# Patient Record
Sex: Female | Born: 1970 | Race: White | Hispanic: No | Marital: Married | Smoking: Never smoker
Health system: Southern US, Community
[De-identification: ages and names within clinical notes are randomized; demographics above are authoritative.]

## PROBLEM LIST (undated history)

## (undated) DIAGNOSIS — G43909 Migraine, unspecified, not intractable, without status migrainosus: Secondary | ICD-10-CM

## (undated) DIAGNOSIS — Z98891 History of uterine scar from previous surgery: Secondary | ICD-10-CM

## (undated) DIAGNOSIS — F419 Anxiety disorder, unspecified: Secondary | ICD-10-CM

## (undated) DIAGNOSIS — Z5189 Encounter for other specified aftercare: Secondary | ICD-10-CM

## (undated) DIAGNOSIS — IMO0001 Reserved for inherently not codable concepts without codable children: Secondary | ICD-10-CM

## (undated) DIAGNOSIS — S0990XA Unspecified injury of head, initial encounter: Secondary | ICD-10-CM

## (undated) DIAGNOSIS — R0602 Shortness of breath: Secondary | ICD-10-CM

## (undated) HISTORY — DX: Migraine, unspecified, not intractable, without status migrainosus: G43.909

## (undated) HISTORY — DX: Unspecified injury of head, initial encounter: S09.90XA

## (undated) HISTORY — PX: WISDOM TOOTH EXTRACTION: SHX21

---

## 2009-10-31 ENCOUNTER — Ambulatory Visit: Payer: Self-pay | Admitting: Family Medicine

## 2009-10-31 DIAGNOSIS — E669 Obesity, unspecified: Secondary | ICD-10-CM

## 2009-10-31 DIAGNOSIS — IMO0002 Reserved for concepts with insufficient information to code with codable children: Secondary | ICD-10-CM | POA: Insufficient documentation

## 2010-03-26 NOTE — Assessment & Plan Note (Signed)
Summary: NP,tcb   Vital Signs:  Patient profile:   40 year old female Weight:      180 pounds Temp:     98.4 degrees F oral Pulse rate:   69 / minute BP sitting:   115 / 80  (right arm) Cuff size:   regular  Vitals Entered By: Jimmy Footman, CMA (October 31, 2009 3:47 PM) CC: New Patient Is Patient Diabetic? No Pain Assessment Patient in pain? no        Primary Care Provider:  Helane Rima DO  CC:  New Patient.  History of Present Illness: 40 yo F presenting for New Patient exam. Concerns:  1. LUQ Pain: Intermittent, x several weeks, none today, no identified trigger, nothing makes better or worse, last BM today - soft, no N/V/D, sometimes has constipation and straining. Unsure of heartburn, no sour taste in mouth or back pain.   2. Pain with Sex: Not all of the time. With penetration only. LMP x 1 week ago. No concerns for STDs. Hx of HPV. 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 No pain with BM.  3. Hemorrhoids: From straining. No BRBPR recently.  Habits & Providers  Alcohol-Tobacco-Diet     Tobacco Status: never  Current Medications (verified): 1)  None  Allergies (verified): No Known Drug Allergies  Past History:  Past Medical History: None  Past Surgical History: None  Family History: Mother died at age 62 of lung cancer - adenocarcinoma, not a smoker. Father alive at age 23. Grandmother died of lung cancer - smoker.  Social History: Lives with husband, Rolm Gala. Has a dog and cat. 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 Endorses a healthy diet. For fun, gardens, shops, and watches TV. College graduate. Married. Denies tobacco and drug use. Ocassional wine. Does not exercise.Smoking Status:  never  Review of Systems General:  Denies chills and fever. CV:  Denies chest pain or discomfort, palpitations, shortness of breath with exertion, and swelling of feet. Resp:  Denies cough. GI:  Complains of abdominal pain and indigestion; denies bloody stools,  constipation, diarrhea, nausea, and vomiting.  Physical Exam  General:  Well-developed, well-nourished, in no acute distress; alert, appropriate and cooperative throughout examination. Vitals reviewed. Abdomen:  Bowel sounds positive,abdomen soft and non-tender without masses, organomegaly or hernias noted. Pulses:  R and L dorsalis pedis and posterior tibial pulses are full and equal bilaterally. Extremities:  No edema. Psych:  Oriented X3, memory intact for recent and remote, normally interactive, good eye contact, not anxious appearing, and not depressed appearing.     Impression & Recommendations:  Problem # 1:  ABDOMINAL PAIN, LEFT UPPER QUADRANT (ICD-789.02) Assessment New No RED FLAGs. Likely constipation versus dyspepsia. Discussed treatment for both, including maintaning soft stools and behavioral modication/OTC Zantac.  Problem # 2:  DYSPAREUNIA (ICD-625.0) Assessment: New Unclear cause at this time. DDx: lack of lubrication, infection, endometriosis. Will do exam at next visit - making CPE with PAP.  Problem # 3:  HEMORRHOIDS (ICD-455.6) Assessment: Unchanged No concerns today. Will eval at next visit during (see #2). Will check CBC.  Other Orders: Future Orders: TSH-[facility] ([id number]) ... 11/01/2010 CBC-[facility] (96295) ... 11/01/2010 Lipid-[facility] ([id number]) ... 11/01/2010 Comp Met-[facility] ([id number]) ... 11/02/2010  Patient Instructions: 1)  It was nice to meet you today! 2)  You may take over-the-counter Zantac for heartburn to see if it helps your abdominal pain. 3)  Please schedule a FULL PHYSICAL for your next visit.

## 2010-07-19 ENCOUNTER — Inpatient Hospital Stay (HOSPITAL_COMMUNITY): Payer: BC Managed Care – PPO

## 2010-07-19 ENCOUNTER — Inpatient Hospital Stay (HOSPITAL_COMMUNITY)
Admission: AD | Admit: 2010-07-19 | Discharge: 2010-07-19 | Disposition: A | Discharge status: Home / Self Care | Payer: BC Managed Care – PPO | Source: Ambulatory Visit | Attending: Obstetrics and Gynecology | Admitting: Obstetrics and Gynecology

## 2010-07-19 DIAGNOSIS — O209 Hemorrhage in early pregnancy, unspecified: Secondary | ICD-10-CM

## 2010-07-19 LAB — ABO/RH: ABO/RH(D): O NEG

## 2010-07-19 LAB — URINE MICROSCOPIC-ADD ON

## 2010-07-19 LAB — URINALYSIS, ROUTINE W REFLEX MICROSCOPIC
Bilirubin Urine: NEGATIVE
Glucose, UA: NEGATIVE mg/dL
Specific Gravity, Urine: 1.02 (ref 1.005–1.030)
Urobilinogen, UA: 0.2 mg/dL (ref 0.0–1.0)

## 2010-07-20 LAB — RH IMMUNE GLOBULIN WORKUP (NOT WOMEN'S HOSP)

## 2010-08-01 ENCOUNTER — Ambulatory Visit (INDEPENDENT_AMBULATORY_CARE_PROVIDER_SITE_OTHER): Payer: BC Managed Care – PPO | Admitting: Family Medicine

## 2010-08-01 ENCOUNTER — Encounter: Payer: Self-pay | Admitting: Family Medicine

## 2010-08-01 DIAGNOSIS — R0789 Other chest pain: Secondary | ICD-10-CM

## 2010-08-01 DIAGNOSIS — M549 Dorsalgia, unspecified: Secondary | ICD-10-CM

## 2010-08-01 NOTE — Patient Instructions (Signed)
We will get she is set up to see Jeanene Erb for evaluation and treatment of your back pain.  Remember to walk daily.  Avoid sitting.  Start folic acid today.  Return p.r.n.
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
## 2010-08-06 ENCOUNTER — Ambulatory Visit: Payer: Self-pay | Admitting: Family Medicine

## 2010-08-26 ENCOUNTER — Encounter (HOSPITAL_COMMUNITY): Payer: Self-pay | Admitting: *Deleted

## 2010-08-27 ENCOUNTER — Ambulatory Visit (HOSPITAL_COMMUNITY)
Admission: RE | Admit: 2010-08-27 | Payer: BC Managed Care – PPO | Source: Ambulatory Visit | Admitting: Obstetrics and Gynecology

## 2010-09-02 MED ORDER — DOXYCYCLINE HYCLATE 100 MG PO TABS: 200.0000 mg | ORAL_TABLET | Freq: Once | ORAL | Status: DC

## 2010-09-03 ENCOUNTER — Encounter (HOSPITAL_COMMUNITY): Payer: Self-pay | Admitting: Anesthesiology

## 2010-09-03 ENCOUNTER — Ambulatory Visit (HOSPITAL_COMMUNITY)
Admission: RE | Admit: 2010-09-03 | Discharge: 2010-09-03 | Disposition: A | Discharge status: Home / Self Care | Payer: BC Managed Care – PPO | Source: Ambulatory Visit | Attending: Obstetrics and Gynecology | Admitting: Obstetrics and Gynecology

## 2010-09-03 ENCOUNTER — Encounter (HOSPITAL_COMMUNITY)
Admission: RE | Disposition: A | Discharge status: Home / Self Care | Payer: Self-pay | Source: Ambulatory Visit | Attending: Obstetrics and Gynecology

## 2010-09-03 ENCOUNTER — Encounter (HOSPITAL_COMMUNITY): Payer: Self-pay | Admitting: *Deleted

## 2010-09-03 ENCOUNTER — Other Ambulatory Visit: Payer: Self-pay | Admitting: Obstetrics and Gynecology

## 2010-09-03 ENCOUNTER — Ambulatory Visit (HOSPITAL_COMMUNITY): Payer: BC Managed Care – PPO | Admitting: Anesthesiology

## 2010-09-03 DIAGNOSIS — O021 Missed abortion: Secondary | ICD-10-CM | POA: Insufficient documentation

## 2010-09-03 HISTORY — DX: Reserved for inherently not codable concepts without codable children: IMO0001

## 2010-09-03 HISTORY — PX: DILATION AND EVACUATION: SHX1459

## 2010-09-03 HISTORY — DX: Encounter for other specified aftercare: Z51.89

## 2010-09-03 LAB — CBC
MCH: 30.6 pg (ref 26.0–34.0)
MCV: 86.9 fL (ref 78.0–100.0)
Platelets: 236 10*3/uL (ref 150–400)
RBC: 4.51 MIL/uL (ref 3.87–5.11)
RDW: 12.6 % (ref 11.5–15.5)
WBC: 6.6 10*3/uL (ref 4.0–10.5)

## 2010-09-03 LAB — ABO/RH: ABO/RH(D): O NEG

## 2010-09-03 SURGERY — DILATION AND EVACUATION, UTERUS
Anesthesia: Monitor Anesthesia Care

## 2010-09-03 MED ORDER — HYDROCODONE-ACETAMINOPHEN 5-500 MG PO TABS: 1.0000 | ORAL_TABLET | Freq: Four times a day (QID) | ORAL | Status: AC | PRN

## 2010-09-03 MED ORDER — DEXAMETHASONE SODIUM PHOSPHATE 10 MG/ML IJ SOLN
INTRAMUSCULAR | Status: DC | PRN
  Administered 2010-09-03: 10 mg via INTRAVENOUS

## 2010-09-03 MED ORDER — LIDOCAINE HCL 1 % IJ SOLN
INTRAMUSCULAR | Status: DC | PRN
  Administered 2010-09-03: 10 mL via INTRADERMAL

## 2010-09-03 MED ORDER — LACTATED RINGERS IV SOLN
INTRAVENOUS | Status: DC
  Administered 2010-09-03: 07:00:00 via INTRAVENOUS

## 2010-09-03 MED ORDER — IBUPROFEN 800 MG PO TABS: 800.0000 mg | ORAL_TABLET | Freq: Three times a day (TID) | ORAL | Status: AC | PRN

## 2010-09-03 MED ORDER — DOXYCYCLINE HYCLATE 100 MG PO TABS
200.0000 mg | ORAL_TABLET | Freq: Once | ORAL | Status: AC
  Administered 2010-09-03: 200 mg via ORAL

## 2010-09-03 MED ORDER — KETOROLAC TROMETHAMINE 30 MG/ML IJ SOLN
INTRAMUSCULAR | Status: DC | PRN
  Administered 2010-09-03: 30 mg via INTRAVENOUS

## 2010-09-03 MED ORDER — DOXYCYCLINE HYCLATE 100 MG PO TABS
ORAL_TABLET | ORAL | Status: AC
  Filled 2010-09-03: qty 2

## 2010-09-03 MED ORDER — PROPOFOL 10 MG/ML IV EMUL
INTRAVENOUS | Status: DC | PRN
  Administered 2010-09-03 (×5): 20 mg via INTRAVENOUS

## 2010-09-03 MED ORDER — LACTATED RINGERS IV SOLN
INTRAVENOUS | Status: DC | PRN
  Administered 2010-09-03 (×2): via INTRAVENOUS

## 2010-09-03 MED ORDER — MIDAZOLAM HCL 5 MG/5ML IJ SOLN
INTRAMUSCULAR | Status: DC | PRN
  Administered 2010-09-03 (×2): 1 mg via INTRAVENOUS

## 2010-09-03 MED ORDER — FENTANYL CITRATE 0.05 MG/ML IJ SOLN
INTRAMUSCULAR | Status: DC | PRN
  Administered 2010-09-03 (×2): 50 ug via INTRAVENOUS

## 2010-09-03 MED ORDER — LIDOCAINE HCL (CARDIAC) 20 MG/ML IV SOLN
INTRAVENOUS | Status: DC | PRN
  Administered 2010-09-03: 80 mg via INTRAVENOUS

## 2010-09-03 MED ORDER — ONDANSETRON HCL 4 MG/2ML IJ SOLN
INTRAMUSCULAR | Status: DC | PRN
  Administered 2010-09-03: 4 mg via INTRAMUSCULAR

## 2010-09-03 SURGICAL SUPPLY — 19 items
CATH ROBINSON RED A/P 16FR (CATHETERS) ×2 IMPLANT
CLOTH BEACON ORANGE TIMEOUT ST (SAFETY) ×2 IMPLANT
DECANTER SPIKE VIAL GLASS SM (MISCELLANEOUS) ×2 IMPLANT
DRAPE UTILITY XL STRL (DRAPES) ×2 IMPLANT
GLOVE BIO SURGEON STRL SZ 6.5 (GLOVE) ×2 IMPLANT
GLOVE BIO SURGEON STRL SZ7 (GLOVE) ×2 IMPLANT
GOWN BRE IMP SLV AUR LG STRL (GOWN DISPOSABLE) ×4 IMPLANT
KIT BERKELEY 1ST TRIMESTER 3/8 (MISCELLANEOUS) ×2 IMPLANT
NEEDLE SPNL 22GX3.5 QUINCKE BK (NEEDLE) ×2 IMPLANT
NS IRRIG 1000ML POUR BTL (IV SOLUTION) ×2 IMPLANT
PACK VAGINAL MINOR WOMEN LF (CUSTOM PROCEDURE TRAY) ×2 IMPLANT
PAD PREP 24X48 CUFFED NSTRL (MISCELLANEOUS) ×2 IMPLANT
SET BERKELEY SUCTION TUBING (SUCTIONS) ×2 IMPLANT
SYR CONTROL 10ML LL (SYRINGE) ×2 IMPLANT
TOWEL OR 17X24 6PK STRL BLUE (TOWEL DISPOSABLE) ×4 IMPLANT
VACURETTE 10 RIGID CVD (CANNULA) IMPLANT
VACURETTE 7MM CVD STRL WRAP (CANNULA) ×2 IMPLANT
VACURETTE 8 RIGID CVD (CANNULA) IMPLANT
VACURETTE 9 RIGID CVD (CANNULA) IMPLANT

## 2010-09-03 NOTE — Preoperative (Signed)
Beta Blockers   Reason not to administer Beta Blockers:Not Applicable 

## 2010-09-03 NOTE — Op Note (Signed)
NAMEDorothy Smith NO.:  [id number]  MEDICAL RECORD NO.:  [id number]  LOCATION:  [facility]                          FACILITY:  [facility]  PHYSICIAN:  Sherron Monday, MD        DATE OF BIRTH:  09-29-1970  DATE OF PROCEDURE:  09/03/2010 DATE OF DISCHARGE:                              OPERATIVE REPORT   ADMITTING DIAGNOSIS:  Missed abortion at approximately 7 weeks.  POSTOPERATIVE DIAGNOSIS:  Missed abortion at approximately 7 weeks.  PROCEDURE:  Suction dilation and curettage.  ANESTHESIA:  MAC.  SURGEON:  Sherron Monday, MD.  FINDINGS:  A 7 to 8-week size uterus with products of conception.  ESTIMATED BLOOD LOSS:  Minimal.  IV FLUIDS:  Approximately a liter.  URINE OUTPUT:  Approximately 100 mL.  PATHOLOGY:  Products of conception.  COMPLICATIONS:  None.  DISPOSITION:  Stable to PACU.  PROCEDURE:  After informed consent was reviewed with the patient and her husband, she was transported to the OR, placed on the table in supine position.  MAC anesthesia was induced and found to be adequate.  She was then placed in the [place] stirrups, prepped and draped in the normal sterile fashion. Using an open-sided speculum, her cervix was easily visualized.  A paracervical block was placed with 10 mL of 1% lidocaine at 12, 5 and 7 o'clock.  Her cervix was then dilated to accommodate a 7- French curette, was dilated with Pratt dilators to 21.  The brief suction of the uterus was performed yielding products of conception. Hemostasis was assured.  The tenaculum was removed from the cervix. Speculum was removed.  The patient was returned to supine position, awake and in stable condition.  Sponge, lap and needle counts were correct x2 at the end of the case per the operating room staff.     Sherron Monday, MD     JB/MEDQ  D:  09/03/2010  T:  09/03/2010  Job:  [id number]

## 2010-09-03 NOTE — Anesthesia Postprocedure Evaluation (Signed)
Vital signs stable Patient alert Pain and nausea are controlled No apparent anesthetic complications No follow up care needed 
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
## 2010-09-03 NOTE — Addendum Note (Signed)
Addendum  created 09/03/10 0820 by Doreene Burke   Modules edited:Notes Section

## 2010-09-03 NOTE — H&P (Addendum)
09/03/10 40 yo G1 with missed Ab for d&E.  D/W pt R/B/A of surgical procedure.  Pt has not passed tissue and has had repeat US PMH none PSH WTE POBGYNHx G1 present -missed AB H/o abn pap, repeat nl, no STD Meds none All PCN SH denies tob, drugs, occ ETOH; married FH: m lung CA, fa HTN MBT: O- PE AFVSS gen NAD CV RRR Lungs CTAB Abd soft, NT, ND +BS Ext sym, NT  40 yo G1 at 7wk with missed AB for suction D&C Doxycycline for abx  JBOVARD MD Pt received Rhogam prior.

## 2010-09-03 NOTE — Anesthesia Postprocedure Evaluation (Signed)
  Anesthesia Post-op Note  

## 2010-09-03 NOTE — Addendum Note (Signed)
Addendum  created 09/03/10 0910 by Sandrea Hughs.   Modules edited:Anesthesia Attestations

## 2010-09-03 NOTE — Addendum Note (Signed)
Addendum  created 09/03/10 0847 by Sandrea Hughs.   Modules edited:Notes Section

## 2010-09-03 NOTE — Op Note (Addendum)
09/03/10  PreOp Missed AB Post OP Missed AB Procedure suction D&C Surgeon [name] Anes MAC Findings 7 wk size uterus, POC Path PIOC EBL minimal, IVF 1000c, uop 100cc Comp none  Path POC Dispo stable to PACU  Dictation # [id number]

## 2010-09-03 NOTE — Transfer of Care (Signed)
Immediate Anesthesia Transfer of Care Note  Patient: [name]  Procedure(s) Performed:  DILATATION AND EVACUATION (D&E)  Patient Location: PACU  Anesthesia Type: MAC  Level of Consciousness: awake, alert , oriented and patient cooperative  Airway & Oxygen Therapy: Patient Spontanous Breathing and Patient connected to nasal cannula oxygen  Post-op Assessment: Report given to PACU RN, Post -op Vital signs reviewed and stable and Patient moving all extremities  Post vital signs: Reviewed and stable  Complications: No apparent anesthesia complications

## 2010-09-03 NOTE — Addendum Note (Signed)
Addendum  created 09/03/10 0910 by Sandrea Hughs.   Modules edited:Anesthesia Attestations, Notes Section

## 2010-09-24 ENCOUNTER — Encounter (HOSPITAL_COMMUNITY): Payer: Self-pay | Admitting: Obstetrics and Gynecology

## 2010-09-25 DEATH — deceased

## 2010-12-31 ENCOUNTER — Other Ambulatory Visit: Payer: Self-pay | Admitting: Obstetrics and Gynecology

## 2010-12-31 ENCOUNTER — Other Ambulatory Visit: Payer: Self-pay | Admitting: Family Medicine

## 2011-01-01 ENCOUNTER — Other Ambulatory Visit: Payer: Self-pay | Admitting: Obstetrics and Gynecology

## 2011-01-01 DIAGNOSIS — Z1231 Encounter for screening mammogram for malignant neoplasm of breast: Secondary | ICD-10-CM

## 2011-02-04 ENCOUNTER — Ambulatory Visit
Admission: RE | Admit: 2011-02-04 | Discharge: 2011-02-04 | Disposition: A | Discharge status: Home / Self Care | Payer: BC Managed Care – PPO | Source: Ambulatory Visit | Attending: Obstetrics and Gynecology | Admitting: Obstetrics and Gynecology

## 2011-02-04 DIAGNOSIS — Z1231 Encounter for screening mammogram for malignant neoplasm of breast: Secondary | ICD-10-CM

## 2011-03-20 ENCOUNTER — Emergency Department (HOSPITAL_COMMUNITY)
Admission: EM | Admit: 2011-03-20 | Discharge: 2011-03-21 | Disposition: A | Discharge status: Home / Self Care | Payer: No Typology Code available for payment source | Attending: Emergency Medicine | Admitting: Emergency Medicine

## 2011-03-20 ENCOUNTER — Emergency Department (HOSPITAL_COMMUNITY): Payer: No Typology Code available for payment source

## 2011-03-20 ENCOUNTER — Encounter (HOSPITAL_COMMUNITY): Payer: Self-pay | Admitting: Emergency Medicine

## 2011-03-20 DIAGNOSIS — T1490XA Injury, unspecified, initial encounter: Secondary | ICD-10-CM | POA: Insufficient documentation

## 2011-03-20 DIAGNOSIS — M5380 Other specified dorsopathies, site unspecified: Secondary | ICD-10-CM | POA: Insufficient documentation

## 2011-03-20 DIAGNOSIS — M545 Low back pain, unspecified: Secondary | ICD-10-CM | POA: Insufficient documentation

## 2011-03-20 DIAGNOSIS — M25569 Pain in unspecified knee: Secondary | ICD-10-CM | POA: Insufficient documentation

## 2011-03-20 DIAGNOSIS — M542 Cervicalgia: Secondary | ICD-10-CM | POA: Insufficient documentation

## 2011-03-20 LAB — POCT PREGNANCY, URINE: Preg Test, Ur: NEGATIVE

## 2011-03-20 MED ORDER — CYCLOBENZAPRINE HCL 10 MG PO TABS: 10.0000 mg | ORAL_TABLET | Freq: Two times a day (BID) | ORAL | Status: AC | PRN

## 2011-03-20 MED ORDER — ONDANSETRON 4 MG PO TBDP
4.0000 mg | ORAL_TABLET | Freq: Once | ORAL | Status: DC
  Filled 2011-03-20: qty 1

## 2011-03-20 MED ORDER — OXYCODONE-ACETAMINOPHEN 5-325 MG PO TABS
1.0000 | ORAL_TABLET | Freq: Once | ORAL | Status: AC
  Administered 2011-03-20: 1 via ORAL
  Filled 2011-03-20: qty 1

## 2011-03-20 MED ORDER — ONDANSETRON 4 MG PO TBDP
ORAL_TABLET | ORAL | Status: AC
  Filled 2011-03-20: qty 1

## 2011-03-20 MED ORDER — OXYCODONE-ACETAMINOPHEN 5-325 MG PO TABS
2.0000 | ORAL_TABLET | Freq: Once | ORAL | Status: AC
  Administered 2011-03-20: 2 via ORAL
  Filled 2011-03-20: qty 2

## 2011-03-20 MED ORDER — OXYCODONE-ACETAMINOPHEN 5-325 MG PO TABS: 1.0000 | ORAL_TABLET | Freq: Four times a day (QID) | ORAL | Status: AC | PRN
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
## 2011-03-20 NOTE — ED Notes (Signed)
MD/PA Neva Seat T. at bedside assessing the pt.

## 2011-03-20 NOTE — ED Notes (Signed)
Urine collected for the worker  Comp, per pt, it is require by her work

## 2011-03-20 NOTE — ED Notes (Signed)
Pt brought to the fast track room, pt states that she still feels sore, pt reports back pain that starting at the neck area, bilateral, radiating to the shoulders and to the lower back. Upon assessment and palpation, pt can not tolerated anyone touching her back, states it is in a excruciating pain. No bruising or visible deformities noted at this time to the affected area.
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
## 2011-03-21 NOTE — ED Provider Notes (Signed)
Medical screening examination/treatment/procedure(s) were performed by non-physician practitioner and as supervising physician I was immediately available for consultation/collaboration. Vallory Oetken, MD, FACEP   Denvil Canning L Kolston Lacount, MD 03/21/11 1129 

## 2011-06-30 LAB — OB RESULTS CONSOLE ABO/RH: RH Type: NEGATIVE

## 2011-11-10 ENCOUNTER — Other Ambulatory Visit: Payer: Self-pay | Admitting: Obstetrics and Gynecology

## 2011-11-10 DIAGNOSIS — N6009 Solitary cyst of unspecified breast: Secondary | ICD-10-CM

## 2011-11-10 DIAGNOSIS — N644 Mastodynia: Secondary | ICD-10-CM

## 2011-11-11 ENCOUNTER — Other Ambulatory Visit: Payer: Self-pay | Admitting: Obstetrics and Gynecology

## 2011-11-11 ENCOUNTER — Ambulatory Visit
Admission: RE | Admit: 2011-11-11 | Discharge: 2011-11-11 | Disposition: A | Discharge status: Home / Self Care | Payer: BC Managed Care – PPO | Source: Ambulatory Visit | Attending: Obstetrics and Gynecology | Admitting: Obstetrics and Gynecology

## 2011-11-11 DIAGNOSIS — N644 Mastodynia: Secondary | ICD-10-CM

## 2011-11-11 DIAGNOSIS — N6009 Solitary cyst of unspecified breast: Secondary | ICD-10-CM

## 2011-11-14 ENCOUNTER — Other Ambulatory Visit: Payer: BC Managed Care – PPO

## 2011-11-17 ENCOUNTER — Ambulatory Visit
Admission: RE | Admit: 2011-11-17 | Discharge: 2011-11-17 | Disposition: A | Discharge status: Home / Self Care | Payer: BC Managed Care – PPO | Source: Ambulatory Visit | Attending: Obstetrics and Gynecology | Admitting: Obstetrics and Gynecology

## 2011-11-17 ENCOUNTER — Other Ambulatory Visit: Payer: Self-pay | Admitting: Diagnostic Radiology

## 2011-11-17 DIAGNOSIS — N6009 Solitary cyst of unspecified breast: Secondary | ICD-10-CM

## 2011-11-17 DIAGNOSIS — N644 Mastodynia: Secondary | ICD-10-CM

## 2011-12-31 ENCOUNTER — Encounter (HOSPITAL_COMMUNITY): Payer: Self-pay | Admitting: Pharmacist

## 2012-01-08 ENCOUNTER — Encounter (HOSPITAL_COMMUNITY): Payer: Self-pay

## 2012-01-09 ENCOUNTER — Encounter (HOSPITAL_COMMUNITY): Payer: Self-pay

## 2012-01-09 ENCOUNTER — Encounter (HOSPITAL_COMMUNITY)
Admission: RE | Admit: 2012-01-09 | Discharge: 2012-01-09 | Disposition: A | Discharge status: Home / Self Care | Payer: BC Managed Care – PPO | Source: Ambulatory Visit | Attending: Obstetrics and Gynecology | Admitting: Obstetrics and Gynecology

## 2012-01-09 HISTORY — DX: Anxiety disorder, unspecified: F41.9

## 2012-01-09 HISTORY — DX: Shortness of breath: R06.02

## 2012-01-09 LAB — CBC
Platelets: 167 10*3/uL (ref 150–400)
RDW: 13.7 % (ref 11.5–15.5)
WBC: 10.5 10*3/uL (ref 4.0–10.5)

## 2012-01-09 LAB — TYPE AND SCREEN
ABO/RH(D): O NEG
Antibody Screen: POSITIVE

## 2012-01-09 LAB — RPR: RPR Ser Ql: NONREACTIVE

## 2012-01-09 LAB — SURGICAL PCR SCREEN: Staphylococcus aureus: NEGATIVE

## 2012-01-09 NOTE — Patient Instructions (Addendum)
Your procedure is scheduled on:01/20/12  Enter through the Main Entrance at :0700 am Pick up desk phone and dial [phone] and inform us of your arrival.  Please call [phone] if you have any problems the morning of surgery.  Remember: Do not eat or drink after midnight: Monday   Take these meds the morning of surgery with a sip of water:none  DO NOT wear jewelry, eye make-up, lipstick,body lotion, or dark fingernail polish. Do not shave for 48 hours prior to surgery.  If you are to be admitted after surgery, leave suitcase in car until your room has been assigned.
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
## 2012-01-20 ENCOUNTER — Encounter (HOSPITAL_COMMUNITY): Payer: Self-pay | Admitting: *Deleted

## 2012-01-20 ENCOUNTER — Inpatient Hospital Stay (HOSPITAL_COMMUNITY): Payer: BC Managed Care – PPO | Admitting: Anesthesiology

## 2012-01-20 ENCOUNTER — Encounter (HOSPITAL_COMMUNITY): Payer: Self-pay | Admitting: Anesthesiology

## 2012-01-20 ENCOUNTER — Inpatient Hospital Stay (HOSPITAL_COMMUNITY)
Admission: AD | Admit: 2012-01-20 | Discharge: 2012-01-22 | DRG: 371 | Disposition: A | Discharge status: Home / Self Care | Payer: BC Managed Care – PPO | Source: Ambulatory Visit | Attending: Obstetrics and Gynecology | Admitting: Obstetrics and Gynecology

## 2012-01-20 ENCOUNTER — Encounter (HOSPITAL_COMMUNITY)
Admission: AD | Disposition: A | Discharge status: Home / Self Care | Payer: Self-pay | Source: Ambulatory Visit | Attending: Obstetrics and Gynecology

## 2012-01-20 DIAGNOSIS — O09519 Supervision of elderly primigravida, unspecified trimester: Secondary | ICD-10-CM | POA: Diagnosis present

## 2012-01-20 DIAGNOSIS — D259 Leiomyoma of uterus, unspecified: Secondary | ICD-10-CM | POA: Diagnosis present

## 2012-01-20 DIAGNOSIS — O34599 Maternal care for other abnormalities of gravid uterus, unspecified trimester: Secondary | ICD-10-CM | POA: Diagnosis present

## 2012-01-20 DIAGNOSIS — D4959 Neoplasm of unspecified behavior of other genitourinary organ: Secondary | ICD-10-CM | POA: Diagnosis present

## 2012-01-20 DIAGNOSIS — Z98891 History of uterine scar from previous surgery: Secondary | ICD-10-CM

## 2012-01-20 DIAGNOSIS — O99214 Obesity complicating childbirth: Secondary | ICD-10-CM | POA: Diagnosis present

## 2012-01-20 DIAGNOSIS — E669 Obesity, unspecified: Secondary | ICD-10-CM | POA: Diagnosis present

## 2012-01-20 DIAGNOSIS — O321XX0 Maternal care for breech presentation, not applicable or unspecified: Principal | ICD-10-CM | POA: Diagnosis present

## 2012-01-20 HISTORY — DX: History of uterine scar from previous surgery: Z98.891

## 2012-01-20 HISTORY — PX: MYOMECTOMY: SHX85

## 2012-01-20 SURGERY — Surgical Case
Anesthesia: Spinal | Site: Abdomen | Wound class: Clean Contaminated

## 2012-01-20 MED ORDER — SODIUM CHLORIDE 0.9 % IJ SOLN: 3.0000 mL | INTRAMUSCULAR | Status: DC | PRN

## 2012-01-20 MED ORDER — NALBUPHINE HCL 10 MG/ML IJ SOLN
5.0000 mg | INTRAMUSCULAR | Status: DC | PRN
  Administered 2012-01-20: 10 mg via SUBCUTANEOUS
  Filled 2012-01-20 (×2): qty 1

## 2012-01-20 MED ORDER — DIPHENHYDRAMINE HCL 25 MG PO CAPS: 25.0000 mg | ORAL_CAPSULE | ORAL | Status: DC | PRN

## 2012-01-20 MED ORDER — FENTANYL CITRATE 0.05 MG/ML IJ SOLN
INTRAMUSCULAR | Status: AC
  Filled 2012-01-20: qty 2

## 2012-01-20 MED ORDER — NALOXONE HCL 0.4 MG/ML IJ SOLN: 0.4000 mg | INTRAMUSCULAR | Status: DC | PRN

## 2012-01-20 MED ORDER — ONDANSETRON HCL 4 MG/2ML IJ SOLN: 4.0000 mg | INTRAMUSCULAR | Status: DC | PRN

## 2012-01-20 MED ORDER — MORPHINE SULFATE 0.5 MG/ML IJ SOLN
INTRAMUSCULAR | Status: AC
  Filled 2012-01-20: qty 10

## 2012-01-20 MED ORDER — SIMETHICONE 80 MG PO CHEW: 80.0000 mg | CHEWABLE_TABLET | ORAL | Status: DC | PRN

## 2012-01-20 MED ORDER — PHENYLEPHRINE 40 MCG/ML (10ML) SYRINGE FOR IV PUSH (FOR BLOOD PRESSURE SUPPORT)
PREFILLED_SYRINGE | INTRAVENOUS | Status: AC
  Filled 2012-01-20: qty 5

## 2012-01-20 MED ORDER — SENNOSIDES-DOCUSATE SODIUM 8.6-50 MG PO TABS
2.0000 | ORAL_TABLET | Freq: Every day | ORAL | Status: DC
  Administered 2012-01-20 – 2012-01-21 (×2): 2 via ORAL

## 2012-01-20 MED ORDER — LACTATED RINGERS IV SOLN
INTRAVENOUS | Status: DC
  Administered 2012-01-20 (×2): via INTRAVENOUS

## 2012-01-20 MED ORDER — DIPHENHYDRAMINE HCL 25 MG PO CAPS: 25.0000 mg | ORAL_CAPSULE | Freq: Four times a day (QID) | ORAL | Status: DC | PRN

## 2012-01-20 MED ORDER — DIPHENHYDRAMINE HCL 50 MG/ML IJ SOLN: 12.5000 mg | INTRAMUSCULAR | Status: DC | PRN

## 2012-01-20 MED ORDER — NALOXONE HCL 1 MG/ML IJ SOLN
1.0000 ug/kg/h | INTRAVENOUS | Status: DC | PRN
  Filled 2012-01-20: qty 2

## 2012-01-20 MED ORDER — EPHEDRINE 5 MG/ML INJ
INTRAVENOUS | Status: AC
  Filled 2012-01-20: qty 10

## 2012-01-20 MED ORDER — OXYTOCIN 10 UNIT/ML IJ SOLN
INTRAMUSCULAR | Status: AC
  Filled 2012-01-20: qty 1

## 2012-01-20 MED ORDER — MEPERIDINE HCL 25 MG/ML IJ SOLN: 6.2500 mg | INTRAMUSCULAR | Status: DC | PRN

## 2012-01-20 MED ORDER — TETANUS-DIPHTH-ACELL PERTUSSIS 5-2.5-18.5 LF-MCG/0.5 IM SUSP
0.5000 mL | Freq: Once | INTRAMUSCULAR | Status: AC
  Administered 2012-01-22: 0.5 mL via INTRAMUSCULAR
  Filled 2012-01-20: qty 0.5

## 2012-01-20 MED ORDER — SCOPOLAMINE 1 MG/3DAYS TD PT72
MEDICATED_PATCH | TRANSDERMAL | Status: AC
  Administered 2012-01-20: 1.5 mg via TRANSDERMAL
  Filled 2012-01-20: qty 1

## 2012-01-20 MED ORDER — PRENATAL 27-0.8 MG PO TABS
1.0000 | ORAL_TABLET | Freq: Every day | ORAL | Status: DC
  Filled 2012-01-20 (×4): qty 1

## 2012-01-20 MED ORDER — NALBUPHINE HCL 10 MG/ML IJ SOLN
5.0000 mg | INTRAMUSCULAR | Status: DC | PRN
  Filled 2012-01-20: qty 1

## 2012-01-20 MED ORDER — ONDANSETRON HCL 4 MG/2ML IJ SOLN
INTRAMUSCULAR | Status: AC
  Filled 2012-01-20: qty 2

## 2012-01-20 MED ORDER — LACTATED RINGERS IV SOLN: INTRAVENOUS | Status: DC

## 2012-01-20 MED ORDER — FENTANYL CITRATE 0.05 MG/ML IJ SOLN: 25.0000 ug | INTRAMUSCULAR | Status: DC | PRN

## 2012-01-20 MED ORDER — FENTANYL CITRATE 0.05 MG/ML IJ SOLN
INTRAMUSCULAR | Status: DC | PRN
  Administered 2012-01-20: 25 ug via INTRATHECAL

## 2012-01-20 MED ORDER — ONDANSETRON HCL 4 MG/2ML IJ SOLN
INTRAMUSCULAR | Status: DC | PRN
  Administered 2012-01-20: 4 mg via INTRAVENOUS

## 2012-01-20 MED ORDER — MENTHOL 3 MG MT LOZG: 1.0000 | LOZENGE | OROMUCOSAL | Status: DC | PRN

## 2012-01-20 MED ORDER — GENTAMICIN SULFATE 40 MG/ML IJ SOLN
INTRAVENOUS | Status: AC
  Administered 2012-01-20: 365.2 mL via INTRAVENOUS
  Filled 2012-01-20: qty 9.13

## 2012-01-20 MED ORDER — CALCIUM CARBONATE ANTACID 500 MG PO CHEW: 3.0000 | CHEWABLE_TABLET | Freq: Three times a day (TID) | ORAL | Status: DC | PRN

## 2012-01-20 MED ORDER — DIPHENHYDRAMINE HCL 50 MG/ML IJ SOLN: 25.0000 mg | INTRAMUSCULAR | Status: DC | PRN

## 2012-01-20 MED ORDER — SIMETHICONE 80 MG PO CHEW
80.0000 mg | CHEWABLE_TABLET | Freq: Three times a day (TID) | ORAL | Status: DC
  Administered 2012-01-20 – 2012-01-22 (×5): 80 mg via ORAL

## 2012-01-20 MED ORDER — DIBUCAINE 1 % RE OINT: 1.0000 "application " | TOPICAL_OINTMENT | RECTAL | Status: DC | PRN

## 2012-01-20 MED ORDER — WITCH HAZEL-GLYCERIN EX PADS: 1.0000 "application " | MEDICATED_PAD | CUTANEOUS | Status: DC | PRN

## 2012-01-20 MED ORDER — KETOROLAC TROMETHAMINE 30 MG/ML IJ SOLN: 30.0000 mg | Freq: Four times a day (QID) | INTRAMUSCULAR | Status: AC | PRN

## 2012-01-20 MED ORDER — LACTATED RINGERS IV SOLN
INTRAVENOUS | Status: DC | PRN
  Administered 2012-01-20 (×4): via INTRAVENOUS

## 2012-01-20 MED ORDER — EPHEDRINE SULFATE 50 MG/ML IJ SOLN
INTRAMUSCULAR | Status: DC | PRN
  Administered 2012-01-20: 10 mg via INTRAVENOUS
  Administered 2012-01-20 (×3): 5 mg via INTRAVENOUS

## 2012-01-20 MED ORDER — LACTATED RINGERS IV SOLN
40.0000 [IU] | INTRAVENOUS | Status: DC | PRN
  Administered 2012-01-20: 40 [IU] via INTRAVENOUS

## 2012-01-20 MED ORDER — LACTATED RINGERS IV SOLN
INTRAVENOUS | Status: DC | PRN
  Administered 2012-01-20: 09:00:00 via INTRAVENOUS

## 2012-01-20 MED ORDER — ONDANSETRON HCL 4 MG PO TABS: 4.0000 mg | ORAL_TABLET | ORAL | Status: DC | PRN

## 2012-01-20 MED ORDER — MORPHINE SULFATE (PF) 0.5 MG/ML IJ SOLN
INTRAMUSCULAR | Status: DC | PRN
  Administered 2012-01-20: .15 mg via INTRATHECAL

## 2012-01-20 MED ORDER — PRENATAL MULTIVITAMIN CH
1.0000 | ORAL_TABLET | Freq: Every day | ORAL | Status: DC
  Administered 2012-01-21 – 2012-01-22 (×2): 1 via ORAL

## 2012-01-20 MED ORDER — IBUPROFEN 600 MG PO TABS
600.0000 mg | ORAL_TABLET | Freq: Four times a day (QID) | ORAL | Status: DC | PRN
  Administered 2012-01-20: 600 mg via ORAL
  Filled 2012-01-20 (×2): qty 1

## 2012-01-20 MED ORDER — PHENYLEPHRINE HCL 10 MG/ML IJ SOLN
INTRAMUSCULAR | Status: DC | PRN
  Administered 2012-01-20: 80 ug via INTRAVENOUS
  Administered 2012-01-20 (×2): 120 ug via INTRAVENOUS
  Administered 2012-01-20: 40 ug via INTRAVENOUS
  Administered 2012-01-20 (×4): 80 ug via INTRAVENOUS
  Administered 2012-01-20: 40 ug via INTRAVENOUS
  Administered 2012-01-20: 80 ug via INTRAVENOUS

## 2012-01-20 MED ORDER — ONDANSETRON HCL 4 MG/2ML IJ SOLN: 4.0000 mg | Freq: Three times a day (TID) | INTRAMUSCULAR | Status: DC | PRN

## 2012-01-20 MED ORDER — IBUPROFEN 800 MG PO TABS
800.0000 mg | ORAL_TABLET | Freq: Three times a day (TID) | ORAL | Status: DC
  Administered 2012-01-20 – 2012-01-22 (×6): 800 mg via ORAL
  Filled 2012-01-20 (×5): qty 1

## 2012-01-20 MED ORDER — 0.9 % SODIUM CHLORIDE (POUR BTL) OPTIME
TOPICAL | Status: DC | PRN
  Administered 2012-01-20: 1000 mL

## 2012-01-20 MED ORDER — LACTATED RINGERS IV SOLN
INTRAVENOUS | Status: DC
  Administered 2012-01-20: 07:00:00 via INTRAVENOUS

## 2012-01-20 MED ORDER — SCOPOLAMINE 1 MG/3DAYS TD PT72
1.0000 | MEDICATED_PATCH | Freq: Once | TRANSDERMAL | Status: DC
  Administered 2012-01-20: 1.5 mg via TRANSDERMAL

## 2012-01-20 MED ORDER — METOCLOPRAMIDE HCL 5 MG/ML IJ SOLN: 10.0000 mg | Freq: Three times a day (TID) | INTRAMUSCULAR | Status: DC | PRN

## 2012-01-20 MED ORDER — OXYTOCIN 40 UNITS IN LACTATED RINGERS INFUSION - SIMPLE MED: 62.5000 mL/h | INTRAVENOUS | Status: AC

## 2012-01-20 MED ORDER — KETOROLAC TROMETHAMINE 30 MG/ML IJ SOLN
30.0000 mg | Freq: Four times a day (QID) | INTRAMUSCULAR | Status: AC | PRN
  Administered 2012-01-20: 30 mg via INTRAMUSCULAR

## 2012-01-20 MED ORDER — ZOLPIDEM TARTRATE 5 MG PO TABS: 5.0000 mg | ORAL_TABLET | Freq: Every evening | ORAL | Status: DC | PRN

## 2012-01-20 MED ORDER — KETOROLAC TROMETHAMINE 30 MG/ML IJ SOLN
INTRAMUSCULAR | Status: AC
  Administered 2012-01-20: 30 mg via INTRAMUSCULAR
  Filled 2012-01-20: qty 1

## 2012-01-20 MED ORDER — OXYCODONE-ACETAMINOPHEN 5-325 MG PO TABS
1.0000 | ORAL_TABLET | ORAL | Status: DC | PRN
  Administered 2012-01-22 (×3): 1 via ORAL
  Filled 2012-01-20 (×3): qty 1

## 2012-01-20 MED ORDER — BUPIVACAINE IN DEXTROSE 0.75-8.25 % IT SOLN
INTRATHECAL | Status: DC | PRN
  Administered 2012-01-20: 1.5 mL via INTRATHECAL

## 2012-01-20 MED ORDER — LANOLIN HYDROUS EX OINT: 1.0000 "application " | TOPICAL_OINTMENT | CUTANEOUS | Status: DC | PRN

## 2012-01-20 SURGICAL SUPPLY — 33 items
BENZOIN TINCTURE PRP APPL 2/3 (GAUZE/BANDAGES/DRESSINGS) ×2 IMPLANT
CLOTH BEACON ORANGE TIMEOUT ST (SAFETY) ×2 IMPLANT
CONTAINER PREFILL 10% NBF 15ML (MISCELLANEOUS) IMPLANT
DRSG COVADERM 4X10 (GAUZE/BANDAGES/DRESSINGS) ×4 IMPLANT
DURAPREP 26ML APPLICATOR (WOUND CARE) ×2 IMPLANT
ELECT REM PT RETURN 9FT ADLT (ELECTROSURGICAL) ×2
ELECTRODE REM PT RTRN 9FT ADLT (ELECTROSURGICAL) ×1 IMPLANT
EXTRACTOR VACUUM M CUP 4 TUBE (SUCTIONS) ×2 IMPLANT
GLOVE BIO SURGEON STRL SZ 6.5 (GLOVE) ×2 IMPLANT
GLOVE BIO SURGEON STRL SZ7 (GLOVE) ×2 IMPLANT
GOWN PREVENTION PLUS LG XLONG (DISPOSABLE) ×6 IMPLANT
KIT ABG SYR 3ML LUER SLIP (SYRINGE) IMPLANT
NEEDLE HYPO 25X5/8 SAFETYGLIDE (NEEDLE) IMPLANT
NS IRRIG 1000ML POUR BTL (IV SOLUTION) ×2 IMPLANT
PACK C SECTION WH (CUSTOM PROCEDURE TRAY) ×2 IMPLANT
PAD OB MATERNITY 4.3X12.25 (PERSONAL CARE ITEMS) IMPLANT
RTRCTR C-SECT PINK 25CM LRG (MISCELLANEOUS) ×2 IMPLANT
SLEEVE SCD COMPRESS KNEE MED (MISCELLANEOUS) IMPLANT
STAPLER VISISTAT 35W (STAPLE) IMPLANT
STRIP CLOSURE SKIN 1/2X4 (GAUZE/BANDAGES/DRESSINGS) ×2 IMPLANT
SUT MNCRL 0 VIOLET CTX 36 (SUTURE) ×2 IMPLANT
SUT MONOCRYL 0 CTX 36 (SUTURE) ×2
SUT PLAIN 1 NONE 54 (SUTURE) IMPLANT
SUT PLAIN 2 0 XLH (SUTURE) ×2 IMPLANT
SUT VIC AB 0 CT1 27 (SUTURE) ×2
SUT VIC AB 0 CT1 27XBRD ANBCTR (SUTURE) ×2 IMPLANT
SUT VIC AB 2-0 CT1 27 (SUTURE) ×1
SUT VIC AB 2-0 CT1 TAPERPNT 27 (SUTURE) ×1 IMPLANT
SUT VIC AB 4-0 KS 27 (SUTURE) ×2 IMPLANT
SYR BULB IRRIGATION 50ML (SYRINGE) IMPLANT
TOWEL OR 17X24 6PK STRL BLUE (TOWEL DISPOSABLE) ×4 IMPLANT
TRAY FOLEY CATH 14FR (SET/KITS/TRAYS/PACK) ×2 IMPLANT
WATER STERILE IRR 1000ML POUR (IV SOLUTION) IMPLANT

## 2012-01-20 NOTE — Addendum Note (Signed)
Addendum  created 01/20/12 1538 by Gertie Fey, CRNA   Modules edited:Notes Section

## 2012-01-20 NOTE — Transfer of Care (Signed)
Immediate Anesthesia Transfer of Care Note  Patient: [name]  Procedure(s) Performed: Procedure(s) (LRB) with comments: CESAREAN SECTION (N/A) - primary 02/21/12 MYOMECTOMY (N/A)  Patient Location: PACU  Anesthesia Type:Spinal  Level of Consciousness: awake, alert , oriented and patient cooperative  Airway & Oxygen Therapy: Patient Spontanous Breathing  Post-op Assessment: Report given to PACU RN and Post -op Vital signs reviewed and stable  Post vital signs: Reviewed and stable  Complications: No apparent anesthesia complications

## 2012-01-20 NOTE — Anesthesia Procedure Notes (Signed)
Spinal  Patient location during procedure: OR Start time: 01/20/2012 8:41 AM Staffing Anesthesiologist: Raziyah Vanvleck A. Performed by: anesthesiologist  Preanesthetic Checklist Completed: patient identified, site marked, surgical consent, pre-op evaluation, timeout performed, IV checked, risks and benefits discussed and monitors and equipment checked Spinal Block Patient position: sitting Prep: site prepped and draped and DuraPrep Patient monitoring: heart rate, cardiac monitor, continuous pulse ox and blood pressure Approach: midline Location: L3-4 Injection technique: single-shot Needle Needle type: Sprotte  Needle gauge: 24 G Needle length: 9 cm Needle insertion depth: 6 cm Assessment Sensory level: T4 Additional Notes Patient tolerated procedure well. Adequate sensory level.

## 2012-01-20 NOTE — Anesthesia Preprocedure Evaluation (Addendum)
Anesthesia Evaluation  Patient identified by MRN, date of birth, ID band Patient awake    Reviewed: Allergy & Precautions, H&P , NPO status , Patient's Chart, lab work & pertinent test results  Airway       Dental No notable dental hx. (+) Teeth Intact   Pulmonary shortness of breath,  breath sounds clear to auscultation  Pulmonary exam normal       Cardiovascular negative cardio ROS  Rhythm:Regular Rate:Normal     Neuro/Psych  Headaches, Anxiety    GI/Hepatic Neg liver ROS, GERD-  Medicated and Controlled,  Endo/Other  Morbid obesity  Renal/GU negative Renal ROS  negative genitourinary   Musculoskeletal negative musculoskeletal ROS (+)   Abdominal (+) + obese,   Peds  Hematology negative hematology ROS (+)   Anesthesia Other Findings   Reproductive/Obstetrics (+) Pregnancy Fibroid Uterus                          Anesthesia Physical Anesthesia Plan  ASA: III  Anesthesia Plan: Spinal   Post-op Pain Management:    Induction:   Airway Management Planned: Natural Airway  Additional Equipment:   Intra-op Plan:   Post-operative Plan: Extubation in OR  Informed Consent: I have reviewed the patients History and Physical, chart, labs and discussed the procedure including the risks, benefits and alternatives for the proposed anesthesia with the patient or authorized representative who has indicated his/her understanding and acceptance.   Dental advisory given  Plan Discussed with: CRNA, Anesthesiologist and Surgeon  Anesthesia Plan Comments:        Anesthesia Quick Evaluation

## 2012-01-20 NOTE — Anesthesia Postprocedure Evaluation (Signed)
  Anesthesia Post-op Note  Patient: [name]  Procedure(s) Performed: Procedure(s) (LRB) with comments: CESAREAN SECTION (N/A) - primary 02/21/12 MYOMECTOMY (N/A)  Patient Location: PACU and Mother/Baby  Anesthesia Type:Spinal  Level of Consciousness: awake, alert  and oriented  Airway and Oxygen Therapy: Patient Spontanous Breathing  Post-op Pain: none  Post-op Assessment: Post-op Vital signs reviewed  Post-op Vital Signs: Reviewed and stable  Complications: No apparent anesthesia complications

## 2012-01-20 NOTE — Anesthesia Postprocedure Evaluation (Signed)
  Anesthesia Post-op Note  Patient: [name]  Procedure(s) Performed: Procedure(s) (LRB) with comments: CESAREAN SECTION (N/A) - primary 02/21/12 MYOMECTOMY (N/A)  Patient Location: PACU  Anesthesia Type:Spinal  Level of Consciousness: awake, alert  and oriented  Airway and Oxygen Therapy: Patient Spontanous Breathing  Post-op Pain: none  Post-op Assessment: Post-op Vital signs reviewed, Patient's Cardiovascular Status Stable, Respiratory Function Stable, Patent Airway, No signs of Nausea or vomiting, Pain level controlled, No headache, No backache, No residual numbness and No residual motor weakness  Post-op Vital Signs: Reviewed and stable  Complications: No apparent anesthesia complications

## 2012-01-20 NOTE — Interval H&P Note (Signed)
History and Physical Interval Note:  01/20/2012 8:13 AM  [name]  has presented today for surgery, with the diagnosis of breech  The various methods of treatment have been discussed with the patient and family. After consideration of risks, benefits and other options for treatment, the patient has consented to  Procedure(s) (LRB) with comments: CESAREAN SECTION (N/A) - primary 02/21/12 as a surgical intervention .  The patient's history has been reviewed, patient examined, no change in status, stable for surgery.  I have reviewed the patient's chart and labs.  Questions were answered to the patient's satisfaction.     [name],Himmat Enberg

## 2012-01-20 NOTE — Preoperative (Signed)
Beta Blockers   Reason not to administer Beta Blockers:Not Applicable 

## 2012-01-20 NOTE — Brief Op Note (Addendum)
01/20/2012  9:56 AM  PATIENT:  [name]  41 y.o. female  PRE-OPERATIVE DIAGNOSIS:  Breech presentation, IUP at term  POST-OPERATIVE DIAGNOSIS:  IUP term, vertex, multiple anterior uterine fibroids, vacuum assisted delivery  PROCEDURE:  Procedure(s) (LRB) with comments: CESAREAN SECTION (N/A) - primary 02/21/12 MYOMECTOMY (N/A)  SURGEON:  Surgeon(s) and Role:    * Sherron Monday, MD - Primary    * Lavina Hamman, MD - Assisting  ANESTHESIA:   spinal  EBL:  Total I/O In: 2000 [I.V.:2000] Out: 1200 [Blood:1200] uop 100cc clear  BLOOD ADMINISTERED:none  DRAINS: Urinary Catheter (Foley)   LOCAL MEDICATIONS USED:  NONE  SPECIMEN:  Source of Specimen:  Placenta, fibroid  DISPOSITION OF SPECIMEN:  L&D, pathology  COUNTS:  YES  TOURNIQUET:  * No tourniquets in log *  DICTATION: .Other Dictation: Dictation Number [id number]  PLAN OF CARE: Admit to inpatient   PATIENT DISPOSITION:  PACU - hemodynamically stable.   Delay start of Pharmacological VTE agent (>24hrs) due to surgical blood loss or risk of bleeding: not applicable

## 2012-01-20 NOTE — Op Note (Signed)
NAMEGAO, BLAUER            ACCOUNT NO.:  [id number]  MEDICAL RECORD NO.:  [id number]  LOCATION:  9112                          FACILITY:  [facility]  PHYSICIAN:  Sherron Monday, MD        DATE OF BIRTH:  December 27, 1970  DATE OF PROCEDURE:  01/20/2012 DATE OF DISCHARGE:                              OPERATIVE REPORT   PREOPERATIVE DIAGNOSIS:  Intrauterine pregnancy at term, breech presentation.  POSTOPERATIVE DIAGNOSIS:  Intrauterine pregnancy at term, vertex multiple anterior uterine fibroids.  PROCEDURE:  Low-transverse cesarean section, myomectomy.  SURGEON:  Sherron Monday, MD  ASSISTANT:  Zenaida Niece, MD  ANESTHESIA:  Spinal.  EBL:  1200 mL.  URINE OUTPUT:  100 mL clear urine at the end of the procedure.  IV FLUIDS:  2000 mL.  SPECIMEN:  Placenta to L and D, fibroid to Pathology.  COMPLICATIONS:  None.  PROCEDURE:  After informed consent was reviewed with the patient including risks, benefits, and alternatives of surgical procedure, we again discussed the likelihood the baby was in breech presentation. Given the [facility] maneuvers, the patient stated understanding and declined an ultrasound at this point.  The patient was transferred to the OR, where spinal anesthesia was placed and found to be adequate. She was then returned to the supine position in a leftward tilt, prepped and draped in the normal sterile fashion.  A Foley catheter was sterilely placed.  A Pfannenstiel skin incision was made at the level of approximately 2 fingerbreadths above the pubic symphysis, carried through the underlying layer of fascia sharply.  The fascia was incised midline and incision was extended laterally with Mayo scissors.  The inferior aspect of the fascial incision was grasped with clamps and elevated.  The rectus muscles were dissected off both bluntly and sharply.  Attention was then turned to the superior portion which in a similar fashion grasped with pickups, elevated, and  the rectus muscles were dissected off both bluntly and sharply.  Midline was easily identified.  The peritoneum was entered bluntly and this incision extended superiorly and inferiorly with good visualization of the bladder.  The Alexis skin retractor was placed carefully making sure no bowel was entrapped.  Vesicouterine peritoneum was identified, tented up with smooth pickups and the bladder flap was created both digitally and sharply.  The uterus was incised in a transverse fashion between 2 of her fibroids and a fair amount of uterine bleeding was noted.  The infant was delivered from the vertex presentation with the aid of the vacuum.  The placenta was expressed from the uterus and then manually extracted.  The uterus was cleared of all clot and debris.  A fibroid prevented incision from being closed, was excised and sen to pathology prior to closing incision.  The uterine incision was closed with 2 layers of 0 Monocryl, the first of which was running locked and second as an imbricating layer.  This was noted to be hemostatic.  The pelvis explored.  Normal uterus, tubes, and ovaries were noted.  The Alexis retractor was removed.  The peritoneum was reapproximated using 2-0 Vicryl in a running fashion.  Subfascial planes were inspected and found to be hemostatic.  The fascia  with closed with 0 Vicryl running from either side, overlapping in the midline.  The subcuticular tissue was made hemostatic with Bovie cautery and copiously irrigated.  The dead space was closed using 3-0 plain gut.  The skin was closed in subcuticular fashion with 4-0 Vicryl on a Keith needle. Benzoin and Steri-Strips were applied. The patient tolerated the procedure well.  Sponge, lap, and needle counts were correct x2 at the end of procedure.     Sherron Monday, MD     JB/MEDQ  D:  01/20/2012  T:  01/20/2012  Job:  [id number]

## 2012-01-21 LAB — CBC
HCT: 24.7 % — ABNORMAL LOW (ref 36.0–46.0)
Hemoglobin: 8.6 g/dL — ABNORMAL LOW (ref 12.0–15.0)
MCH: 31.4 pg (ref 26.0–34.0)
MCHC: 34.8 g/dL (ref 30.0–36.0)
MCV: 90.1 fL (ref 78.0–100.0)
RBC: 2.74 MIL/uL — ABNORMAL LOW (ref 3.87–5.11)

## 2012-01-21 MED ORDER — RHO D IMMUNE GLOBULIN 1500 UNIT/2ML IJ SOLN
300.0000 ug | Freq: Once | INTRAMUSCULAR | Status: AC
  Administered 2012-01-21: 300 ug via INTRAMUSCULAR
  Filled 2012-01-21: qty 2

## 2012-01-21 NOTE — Progress Notes (Addendum)
Subjective: Postpartum Day 1: Cesarean Delivery Patient reports incisional pain and tolerating PO.  Nl lochia, pain controlled  Objective: Vital signs in last 24 hours: Temp:  [97 F (36.1 C)-97.9 F (36.6 C)] 97.6 F (36.4 C) (11/27 0649) Pulse Rate:  [59-101] 76  (11/27 0649) Resp:  [15-20] 18  (11/27 0649) BP: (98-132)/(57-76) 101/57 mmHg (11/27 0649) SpO2:  [94 %-98 %] 96 % (11/27 0649) Weight:  [102.967 kg (227 lb)] 102.967 kg (227 lb) (11/26 1135)  Physical Exam:  General: alert and no distress Lochia: appropriate Uterine Fundus: firm Incision: healing well DVT Evaluation: No evidence of DVT seen on physical exam.   Basename 01/21/12 0554  HGB 8.6*  HCT 24.7*    Assessment/Plan: Status post Cesarean section. Doing well postoperatively.  Continue current care.  Might want d/c in AM.    [name],Reino Lybbert 01/21/2012, 8:29 AM

## 2012-01-22 ENCOUNTER — Encounter (HOSPITAL_COMMUNITY): Payer: Self-pay | Admitting: Obstetrics and Gynecology

## 2012-01-22 MED ORDER — IBUPROFEN 600 MG PO TABS: 600.0000 mg | ORAL_TABLET | Freq: Four times a day (QID) | ORAL | Status: DC | PRN

## 2012-01-22 MED ORDER — OXYCODONE-ACETAMINOPHEN 5-325 MG PO TABS: 1.0000 | ORAL_TABLET | Freq: Four times a day (QID) | ORAL | Status: DC | PRN

## 2012-01-22 MED ORDER — FERROUS SULFATE 325 (65 FE) MG PO TABS: 325.0000 mg | ORAL_TABLET | Freq: Every day | ORAL | Status: DC

## 2012-01-22 NOTE — Progress Notes (Signed)
Patient ID: [name], female   DOB: 10-Jun-1970, 41 y.o.   MRN: [id number] #2 afebrile BP normal Ambulating, tolerating liquids and passing flatus For d/c

## 2012-01-23 LAB — RH IG WORKUP (INCLUDES ABO/RH): Unit division: 0

## 2012-01-24 LAB — TYPE AND SCREEN
DAT, IgG: NEGATIVE
Unit division: 0

## 2012-01-26 NOTE — Discharge Summary (Signed)
NAMETAHARI, CLABAUGH            ACCOUNT NO.:  [id number]  MEDICAL RECORD NO.:  [id number]  LOCATION:                                 FACILITY:  PHYSICIAN:  Malachi Pro. Ambrose Mantle, M.D. DATE OF BIRTH:  1970-12-27  DATE OF ADMISSION: DATE OF DISCHARGE:                              DISCHARGE SUMMARY   This is a 40 year old white female, para 0-0-1-0, gravida 2, at 39+ weeks' gestation with breech presentation, admitted for C-section. Pregnancy complicated by advanced maternal age, Rh negative, breast fibroadenoma, blood group and type O negative, negative antibody, rubella immune, RPR nonreactive, hepatitis B surface antigen negative, HIV negative, GBS negative, GC and Chlamydia negative, cystic fibrosis negative.  First trimester screen and AFP normal.  Tay-Sachs negative, 1- hour Glucola 109.  The patient was admitted and taken to the operating room, where she underwent a low-transverse cervical C-section.  An anterior uterine fibroid myomectomy by Dr. Ellyn Hack with Dr. Jackelyn Knife assisting under spinal anesthesia.  A transverse incision was made and a transverse incision was made into the uterus, between 2 of her fibroids. The infant was delivered from the vertex position with the aid of a vacuum.  Postpartum, the patient did quite well, and she was ambulating, tolerating a diet, voiding well, passing flatus, and she was ready for discharge on the second postpartum day.  Preoperative blood work is not available.  The postoperative hemoglobin was 8.6, hematocrit 24.7, white count 10,900, platelet count 146,000.  Actually hemoglobin on January 09, 2012, was 12.4, hematocrit 35.0, white count 10,500.  RPR on November 15, was nonreactive.  FINAL DIAGNOSES:  Intrauterine pregnancy at term, delivered vertex by cesarean section, fibroids, obesity.  OPERATION:  Low-transverse cervical cesarean section, myomectomy.  FINAL CONDITION:  Improved.  INSTRUCTIONS:  Include our regular discharge  instruction booklet as well as an after visit summary.  The patient is advised to take ferrous sulfate 325 mg twice daily.  Continue her prenatal vitamins, Motrin 600 mg 1 every 6 hours as needed for pain and Percocet 5/325, 30 tablets, 1 every 6 hours as needed for pain given at discharge.  She is advised to return to see Dr. Ellyn Hack in 2 weeks for followup examination.     Malachi Pro. Ambrose Mantle, M.D.     TFH/MEDQ  D:  01/22/2012  T:  01/22/2012  Job:  [id number]

## 2012-03-10 ENCOUNTER — Other Ambulatory Visit: Payer: Self-pay | Admitting: Obstetrics and Gynecology

## 2012-03-10 DIAGNOSIS — Z1231 Encounter for screening mammogram for malignant neoplasm of breast: Secondary | ICD-10-CM

## 2012-03-11 ENCOUNTER — Ambulatory Visit
Admission: RE | Admit: 2012-03-11 | Discharge: 2012-03-11 | Disposition: A | Discharge status: Home / Self Care | Payer: BC Managed Care – PPO | Source: Ambulatory Visit | Attending: Obstetrics and Gynecology | Admitting: Obstetrics and Gynecology

## 2012-03-11 DIAGNOSIS — Z1231 Encounter for screening mammogram for malignant neoplasm of breast: Secondary | ICD-10-CM

## 2012-03-17 ENCOUNTER — Other Ambulatory Visit: Payer: Self-pay | Admitting: Obstetrics and Gynecology

## 2012-03-17 DIAGNOSIS — R928 Other abnormal and inconclusive findings on diagnostic imaging of breast: Secondary | ICD-10-CM

## 2012-03-26 ENCOUNTER — Ambulatory Visit
Admission: RE | Admit: 2012-03-26 | Discharge: 2012-03-26 | Disposition: A | Discharge status: Home / Self Care | Payer: BC Managed Care – PPO | Source: Ambulatory Visit | Attending: Obstetrics and Gynecology | Admitting: Obstetrics and Gynecology

## 2012-03-26 DIAGNOSIS — R928 Other abnormal and inconclusive findings on diagnostic imaging of breast: Secondary | ICD-10-CM
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
## 2013-01-07 ENCOUNTER — Other Ambulatory Visit: Payer: Self-pay | Admitting: Obstetrics and Gynecology

## 2013-01-07 DIAGNOSIS — N6310 Unspecified lump in the right breast, unspecified quadrant: Secondary | ICD-10-CM

## 2013-01-07 DIAGNOSIS — N6320 Unspecified lump in the left breast, unspecified quadrant: Secondary | ICD-10-CM

## 2013-01-31 ENCOUNTER — Ambulatory Visit
Admission: RE | Admit: 2013-01-31 | Discharge: 2013-01-31 | Disposition: A | Discharge status: Home / Self Care | Payer: BC Managed Care – PPO | Source: Ambulatory Visit | Attending: Obstetrics and Gynecology | Admitting: Obstetrics and Gynecology

## 2013-01-31 DIAGNOSIS — N6320 Unspecified lump in the left breast, unspecified quadrant: Secondary | ICD-10-CM

## 2013-01-31 DIAGNOSIS — N6310 Unspecified lump in the right breast, unspecified quadrant: Secondary | ICD-10-CM

## 2013-04-22 ENCOUNTER — Telehealth: Payer: Self-pay | Admitting: Family Medicine

## 2013-04-22 NOTE — Telephone Encounter (Signed)
Patient Information:  Caller Name: Jenny Reichmann  Phone: [phone]  Patient: [name], [name]  Gender: Female  DOB: 1970-10-28  Age: 43 Years  PCP: Stevie Kern [facility])  Pregnant: No  Office Follow Up:  Does the office need to follow up with this patient?: Yes  Instructions For The Office: Please contact patient regarding chest discomfort. Declines appt today "wants to get on the book". PLEASE CONTACT  RN Note:  Please contact patient regarding chest discomfort. Declines appt today "wants to get on the book". PLEASE CONTACT  Symptoms  Reason For Call & Symptoms: Patient states yesterday while sitting felt a sharp pain under left breast. Lasting 30 seconds but was sore afterwards. Non radiating. No other symptoms. Left side of chest remains sore. Pain with inspiration, no pain with movement. discomfort is constant.  No cough or congestion. No injury  Reviewed Health History In EMR: Yes  Reviewed Medications In EMR: Yes  Reviewed Allergies In EMR: Yes  Reviewed Surgeries / Procedures: Yes  Date of Onset of Symptoms: 04/21/2013 OB / GYN:  LMP: 04/22/2013  Guideline(s) Used:  Chest Pain  Disposition Per Guideline:   See Today in Office  Reason For Disposition Reached:   All other patients with chest pain  Advice Given:  Call Back If:  Severe chest pain  Constant chest pain lasting longer than 5 minutes  Difficulty breathing  You become worse.  Patient Will Follow Care Advice:  YES

## 2013-04-22 NOTE — Telephone Encounter (Signed)
Spoke with patient and she is no longer having chest pain.  Please call patient and schedule a physical with Dr Sherren Mocha

## 2013-04-25 NOTE — Telephone Encounter (Signed)
Pt has been sch for fu on chest pain and cpx

## 2013-04-26 ENCOUNTER — Ambulatory Visit (INDEPENDENT_AMBULATORY_CARE_PROVIDER_SITE_OTHER): Payer: BC Managed Care – PPO | Admitting: Family Medicine

## 2013-04-26 ENCOUNTER — Encounter: Payer: Self-pay | Admitting: Family Medicine

## 2013-04-26 VITALS — BP 110/70 | Temp 98.4°F | Ht 63.25 in | Wt 200.0 lb

## 2013-04-26 DIAGNOSIS — R0789 Other chest pain: Secondary | ICD-10-CM

## 2013-04-26 DIAGNOSIS — K625 Hemorrhage of anus and rectum: Secondary | ICD-10-CM

## 2013-04-26 NOTE — Patient Instructions (Signed)
If the chest wall pain is persistent and occurs for days or hours then I would recommend Motrin 600 mg twice daily with food  Since you're trying to get pregnant I would take the folic acid daily  We will get you set up for a GI consult for further evaluation of the rectal leading
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
## 2013-04-26 NOTE — Progress Notes (Signed)
Pre visit review using our clinic review tool, if applicable. No additional management support is needed unless otherwise documented below in the visit note. 

## 2013-05-04 ENCOUNTER — Encounter: Payer: Self-pay | Admitting: Gastroenterology

## 2013-06-02 ENCOUNTER — Other Ambulatory Visit (INDEPENDENT_AMBULATORY_CARE_PROVIDER_SITE_OTHER): Payer: BC Managed Care – PPO

## 2013-06-02 DIAGNOSIS — Z0000 Encounter for general adult medical examination without abnormal findings: Secondary | ICD-10-CM

## 2013-06-02 LAB — HEPATIC FUNCTION PANEL
ALBUMIN: 3.8 g/dL (ref 3.5–5.2)
ALT: 20 U/L (ref 0–35)
AST: 15 U/L (ref 0–37)
Alkaline Phosphatase: 47 U/L (ref 39–117)
BILIRUBIN TOTAL: 1.3 mg/dL — AB (ref 0.3–1.2)
Bilirubin, Direct: 0.1 mg/dL (ref 0.0–0.3)
Total Protein: 6.7 g/dL (ref 6.0–8.3)

## 2013-06-02 LAB — LIPID PANEL
CHOLESTEROL: 193 mg/dL (ref 0–200)
HDL: 40 mg/dL (ref >39.00)
LDL Cholesterol: 128 mg/dL — ABNORMAL HIGH (ref 0–99)
TRIGLYCERIDES: 127 mg/dL (ref 0.0–149.0)
Total CHOL/HDL Ratio: 5
VLDL: 25.4 mg/dL (ref 0.0–40.0)

## 2013-06-02 LAB — POCT URINALYSIS DIPSTICK
BILIRUBIN UA: NEGATIVE
Blood, UA: NEGATIVE
GLUCOSE UA: NEGATIVE
KETONES UA: NEGATIVE
Leukocytes, UA: NEGATIVE
Nitrite, UA: NEGATIVE
Spec Grav, UA: 1.02
Urobilinogen, UA: 0.2
pH, UA: 7

## 2013-06-02 LAB — CBC WITH DIFFERENTIAL/PLATELET
Basophils Absolute: 0 10*3/uL (ref 0.0–0.1)
Basophils Relative: 0.5 % (ref 0.0–3.0)
EOS PCT: 0.8 % (ref 0.0–5.0)
Eosinophils Absolute: 0.1 10*3/uL (ref 0.0–0.7)
HEMATOCRIT: 41.6 % (ref 36.0–46.0)
Hemoglobin: 14.1 g/dL (ref 12.0–15.0)
LYMPHS ABS: 2.1 10*3/uL (ref 0.7–4.0)
Lymphocytes Relative: 31.2 % (ref 12.0–46.0)
MCHC: 33.8 g/dL (ref 30.0–36.0)
MCV: 90 fl (ref 78.0–100.0)
MONO ABS: 0.4 10*3/uL (ref 0.1–1.0)
Monocytes Relative: 5.8 % (ref 3.0–12.0)
NEUTROS ABS: 4.2 10*3/uL (ref 1.4–7.7)
Neutrophils Relative %: 61.7 % (ref 43.0–77.0)
PLATELETS: 249 10*3/uL (ref 150.0–400.0)
RBC: 4.62 Mil/uL (ref 3.87–5.11)
RDW: 12.9 % (ref 11.5–14.6)
WBC: 6.9 10*3/uL (ref 4.5–10.5)

## 2013-06-02 LAB — BASIC METABOLIC PANEL
BUN: 13 mg/dL (ref 6–23)
CHLORIDE: 107 meq/L (ref 96–112)
CO2: 28 mEq/L (ref 19–32)
Calcium: 8.9 mg/dL (ref 8.4–10.5)
Creatinine, Ser: 0.7 mg/dL (ref 0.4–1.2)
GFR: 91.22 mL/min (ref >60.00)
GLUCOSE: 83 mg/dL (ref 70–99)
POTASSIUM: 4.1 meq/L (ref 3.5–5.1)
Sodium: 139 mEq/L (ref 135–145)

## 2013-06-02 LAB — TSH: TSH: 2.32 u[IU]/mL (ref 0.35–5.50)

## 2013-06-09 ENCOUNTER — Encounter: Payer: Self-pay | Admitting: Family Medicine

## 2013-06-09 ENCOUNTER — Ambulatory Visit (INDEPENDENT_AMBULATORY_CARE_PROVIDER_SITE_OTHER): Payer: BC Managed Care – PPO | Admitting: Family Medicine

## 2013-06-09 VITALS — BP 110/70 | Temp 98.2°F | Ht 63.0 in | Wt 202.0 lb

## 2013-06-09 DIAGNOSIS — E669 Obesity, unspecified: Secondary | ICD-10-CM

## 2013-06-09 DIAGNOSIS — Z0000 Encounter for general adult medical examination without abnormal findings: Secondary | ICD-10-CM

## 2013-06-09 NOTE — Progress Notes (Signed)
Pre visit review using our clinic review tool, if applicable. No additional management support is needed unless otherwise documented below in the visit note. 

## 2013-06-09 NOTE — Patient Instructions (Signed)
It's important for you to get in excellent shape have another baby,,,,,,,,,,,, begin a walking program 30 minutes daily......... avoid all carbs

## 2013-06-09 NOTE — Progress Notes (Signed)
   Subjective:    Patient ID: [name], female    DOB: 07-Mar-1970, 43 y.o.   MRN: [id number]  HPI [name] is a 43 year old married female nonsmoker G. one P. One,,,,,, 51-month-old child,,,,,, who comes in today for general physical examination  She's also been in excellent health he said no chronic health problems. She's had difficulty with her weight. It's up to 202 pounds. She doesn't follow a diet and she doesn't exercise on a regular basis.  The not using thing for birth control she wants to have another baby  She gets routine eye care, dental care, does not check her breasts monthly but does get a mammogram annually. LMP 25-413 By GYN yearly  Vaccinations up-to-date   Review of Systems  Constitutional: Negative.   HENT: Negative.   Eyes: Negative.   Respiratory: Negative.   Cardiovascular: Negative.   Gastrointestinal: Negative.   Genitourinary: Negative.   Musculoskeletal: Negative.   Neurological: Negative.   Psychiatric/Behavioral: Negative.        Objective:   Physical Exam  Nursing note and vitals reviewed. Constitutional: She appears well-developed and well-nourished.  HENT:  Head: Normocephalic and atraumatic.  Right Ear: External ear normal.  Left Ear: External ear normal.  Nose: Nose normal.  Mouth/Throat: Oropharynx is clear and moist.  Eyes: EOM are normal. Pupils are equal, round, and reactive to light.  Neck: Normal range of motion. Neck supple. No thyromegaly present.  Cardiovascular: Normal rate, regular rhythm, normal heart sounds and intact distal pulses.  Exam reveals no gallop and no friction rub.   No murmur heard. Pulmonary/Chest: Effort normal and breath sounds normal.  Abdominal: Soft. Bowel sounds are normal. She exhibits no distension and no mass. There is no tenderness. There is no rebound.  Genitourinary:  Bilateral breast exam normal BSE was taught  Musculoskeletal: Normal range of motion.  Lymphadenopathy:    She has no  cervical adenopathy.  Neurological: She is alert. She has normal reflexes. No cranial nerve deficit. She exhibits normal muscle tone. Coordination normal.  Skin: Skin is warm and dry.  Psychiatric: She has a normal mood and affect. Her behavior is normal. Judgment and thought content normal.          Assessment & Plan:  Healthy female  Overweight,,,,,, diet exercise and weight loss

## 2013-06-20 ENCOUNTER — Ambulatory Visit: Payer: BC Managed Care – PPO | Admitting: Gastroenterology

## 2013-08-04 ENCOUNTER — Ambulatory Visit: Payer: BC Managed Care – PPO | Admitting: Family Medicine

## 2013-08-15 ENCOUNTER — Encounter: Payer: Self-pay | Admitting: Family Medicine

## 2013-08-15 ENCOUNTER — Ambulatory Visit (INDEPENDENT_AMBULATORY_CARE_PROVIDER_SITE_OTHER): Payer: BC Managed Care – PPO | Admitting: Family Medicine

## 2013-08-15 VITALS — BP 110/80 | Temp 99.6°F | Wt 202.0 lb

## 2013-08-15 DIAGNOSIS — M25559 Pain in unspecified hip: Secondary | ICD-10-CM

## 2013-08-15 DIAGNOSIS — M25552 Pain in left hip: Secondary | ICD-10-CM | POA: Insufficient documentation

## 2013-08-15 DIAGNOSIS — N644 Mastodynia: Secondary | ICD-10-CM

## 2013-08-15 DIAGNOSIS — M79609 Pain in unspecified limb: Secondary | ICD-10-CM

## 2013-08-15 DIAGNOSIS — M79602 Pain in left arm: Secondary | ICD-10-CM | POA: Insufficient documentation

## 2013-08-15 NOTE — Patient Instructions (Signed)
Motrin 600 mg twice daily with food  Walk 30 minutes daily  BSE monthly  Annual  mammography  If you feel anything abnormal or you're not sure return and let us check you

## 2013-08-15 NOTE — Progress Notes (Signed)
   Subjective:    Patient ID: [name], female    DOB: February 28, 1970, 43 y.o.   MRN: [id number]  HPI [name] is a 43 year old female one year status post C-section who comes in today for evaluation of soreness in her left biceps for 5 weeks also soreness in her hip and back for couple months. She carries her baby on her left hip and left arm a lot. Otherwise no history of trauma  She noticed a lump in the left breast but now can't find it. She does BSE monthly had a mammogram this winter which was normal  She is curious about genetic testing for cancer. Her maternal grandmother and mother both have lung cancer although her mother was a nonsmoker. Her and has multiple myeloma.    Review of Systems    review of systems otherwise negative Objective:   Physical Exam  Well-developed well-nourished female no acute distress vital signs stable she is afebrile examination of left arm shows the skin to be normal wrist elbow shoulder normal there is no palpable tenderness  In the supine position her left hip lacks some extra range of motion and indeed about 75 she complains of soreness.  Bilateral breast exam shows a thickening both breasts 3:00. This is the area that she feels the lump. There is no palpable lump it's more thickening.,,,,,,,,, historically it seems to be more sore before. And after. The soreness is less      Assessment & Plan:  Musculoskeletal pain left arm Motrin 400 twice a day  Some arthritis left hip.. Motrin 400 twice a day  Thickening left and right breast benign observe BSE monthly and you mammography .......Marland Kitchen

## 2013-08-18 ENCOUNTER — Other Ambulatory Visit: Payer: Self-pay

## 2013-12-26 ENCOUNTER — Encounter: Payer: Self-pay | Admitting: Family Medicine

## 2014-04-27 ENCOUNTER — Ambulatory Visit (INDEPENDENT_AMBULATORY_CARE_PROVIDER_SITE_OTHER): Payer: BLUE CROSS/BLUE SHIELD | Admitting: Family Medicine

## 2014-04-27 VITALS — BP 110/80 | Temp 98.8°F | Wt 201.0 lb

## 2014-04-27 DIAGNOSIS — E669 Obesity, unspecified: Secondary | ICD-10-CM

## 2014-04-27 DIAGNOSIS — M25552 Pain in left hip: Secondary | ICD-10-CM

## 2014-04-27 DIAGNOSIS — M79641 Pain in right hand: Secondary | ICD-10-CM | POA: Insufficient documentation

## 2014-04-27 DIAGNOSIS — M79642 Pain in left hand: Secondary | ICD-10-CM

## 2014-04-27 NOTE — Progress Notes (Signed)
Pre visit review using our clinic review tool, if applicable. No additional management support is needed unless otherwise documented below in the visit note. 

## 2014-04-27 NOTE — Progress Notes (Signed)
   Subjective:    Patient ID: [name], female    DOB: 1970/08/21, 44 y.o.   MRN: [id number]  HPI [name] is a 44 year old married female nonsmoker who comes in today for evaluation of joint pain  She's had pain in her left hip for about a year. No history of trauma. The soreness use it lasts for about 30 disc 60 minutes in the morning when she gets up and walks around the soreness goes away. Her father had both hips replaced.  She is overweight 201 pounds  She also soreness in her hands. She points to the middle in the proximal joints of both hands and sore for couple weeks. No history of trauma. No redness no swelling. She's not taken any medication.  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 No GI complaints no genitourinary complaints periods normal Objective:   Physical Exam  Well-developed well-nourished female no acute distress vital signs stable she's afebrile examination hands shows no swelling erythema minimal tenderness.  In the supine position both legs reveal equal length. She has full range of motion of both her left and right hip without discomfort      Assessment & Plan:  Left hip pain,,,,,, probable osteoarthritis,,,, treat symptomatically with aspirin exercise and weight loss  Bilateral hand pain,,,,, again treat symptomatically with aspirin and exercise

## 2014-04-27 NOTE — Patient Instructions (Signed)
Motrin 400 mg twice daily with food or 2 aspirin tablets twice daily with food  Begin an exercise program....... walk 15 minutes daily  Diet.......... carbohydrate free

## 2014-06-21 ENCOUNTER — Other Ambulatory Visit: Payer: BLUE CROSS/BLUE SHIELD

## 2014-06-26 ENCOUNTER — Encounter: Payer: Self-pay | Admitting: Family Medicine

## 2014-09-15 ENCOUNTER — Encounter: Payer: Self-pay | Admitting: Internal Medicine

## 2014-10-06 ENCOUNTER — Other Ambulatory Visit: Payer: Self-pay

## 2014-10-06 DIAGNOSIS — Z1231 Encounter for screening mammogram for malignant neoplasm of breast: Secondary | ICD-10-CM

## 2014-10-23 ENCOUNTER — Encounter: Payer: Self-pay | Admitting: Internal Medicine

## 2014-10-23 ENCOUNTER — Ambulatory Visit (INDEPENDENT_AMBULATORY_CARE_PROVIDER_SITE_OTHER): Payer: BLUE CROSS/BLUE SHIELD | Admitting: Internal Medicine

## 2014-10-23 VITALS — BP 108/84 | HR 72 | Ht 63.19 in | Wt 201.4 lb

## 2014-10-23 DIAGNOSIS — K6289 Other specified diseases of anus and rectum: Secondary | ICD-10-CM

## 2014-10-23 DIAGNOSIS — K625 Hemorrhage of anus and rectum: Secondary | ICD-10-CM

## 2014-10-23 MED ORDER — NA SULFATE-K SULFATE-MG SULF 17.5-3.13-1.6 GM/177ML PO SOLN
1.0000 | Freq: Once | ORAL | Status: DC
Start: 2014-10-23 — End: 2014-11-15
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
## 2014-10-23 NOTE — Patient Instructions (Signed)

## 2014-10-27 ENCOUNTER — Ambulatory Visit: Payer: BLUE CROSS/BLUE SHIELD | Admitting: Gastroenterology

## 2014-11-15 ENCOUNTER — Ambulatory Visit (AMBULATORY_SURGERY_CENTER): Payer: BLUE CROSS/BLUE SHIELD | Admitting: Internal Medicine

## 2014-11-15 ENCOUNTER — Encounter: Payer: Self-pay | Admitting: Internal Medicine

## 2014-11-15 VITALS — BP 120/82 | HR 55 | Temp 98.7°F | Resp 12 | Ht 63.0 in | Wt 201.0 lb

## 2014-11-15 DIAGNOSIS — K621 Rectal polyp: Secondary | ICD-10-CM

## 2014-11-15 DIAGNOSIS — D128 Benign neoplasm of rectum: Secondary | ICD-10-CM

## 2014-11-15 DIAGNOSIS — R1032 Left lower quadrant pain: Secondary | ICD-10-CM

## 2014-11-15 DIAGNOSIS — K625 Hemorrhage of anus and rectum: Secondary | ICD-10-CM

## 2014-11-15 DIAGNOSIS — D129 Benign neoplasm of anus and anal canal: Secondary | ICD-10-CM

## 2014-11-15 MED ORDER — SODIUM CHLORIDE 0.9 % IV SOLN: 500.0000 mL | INTRAVENOUS | Status: DC

## 2014-11-15 NOTE — Patient Instructions (Signed)
YOU HAD AN ENDOSCOPIC PROCEDURE TODAY AT THE [facility]:   Refer to the procedure report that was given to you for any specific questions about what was found during the examination.  If the procedure report does not answer your questions, please call your gastroenterologist to clarify.  If you requested that your care partner not be given the details of your procedure findings, then the procedure report has been included in a sealed envelope for you to review at your convenience later.  YOU SHOULD EXPECT: Some feelings of bloating in the abdomen. Passage of more gas than usual.  Walking can help get rid of the air that was put into your GI tract during the procedure and reduce the bloating. If you had a lower endoscopy (such as a colonoscopy or flexible sigmoidoscopy) you may notice spotting of blood in your stool or on the toilet paper. If you underwent a bowel prep for your procedure, you may not have a normal bowel movement for a few days.  Please Note:  You might notice some irritation and congestion in your nose or some drainage.  This is from the oxygen used during your procedure.  There is no need for concern and it should clear up in a day or so.  SYMPTOMS TO REPORT IMMEDIATELY:   Following lower endoscopy (colonoscopy or flexible sigmoidoscopy):  Excessive amounts of blood in the stool  Significant tenderness or worsening of abdominal pains  Swelling of the abdomen that is new, acute  Fever of 100F or higher   For urgent or emergent issues, a gastroenterologist can be reached at any hour by calling [phone].   DIET: Your first meal following the procedure should be a small meal and then it is ok to progress to your normal diet. Heavy or fried foods are harder to digest and may make you feel nauseous or bloated.  Likewise, meals heavy in dairy and vegetables can increase bloating.  Drink plenty of fluids but you should avoid alcoholic beverages for 24  hours.  ACTIVITY:  You should plan to take it easy for the rest of today and you should NOT DRIVE or use heavy machinery until tomorrow (because of the sedation medicines used during the test).    FOLLOW UP: Our staff will call the number listed on your records the next business day following your procedure to check on you and address any questions or concerns that you may have regarding the information given to you following your procedure. If we do not reach you, we will leave a message.  However, if you are feeling well and you are not experiencing any problems, there is no need to return our call.  We will assume that you have returned to your regular daily activities without incident.  If any biopsies were taken you will be contacted by phone or by letter within the next 1-3 weeks.  Please call us at [phone] if you have not heard about the biopsies in 3 weeks.    SIGNATURES/CONFIDENTIALITY: You and/or your care partner have signed paperwork which will be entered into your electronic medical record.  These signatures attest to the fact that that the information above on your After Visit Summary has been reviewed and is understood.  Full responsibility of the confidentiality of this discharge information lies with you and/or your care-partner.   Handouts were given to your care partner on polyps, hemorrhoids, and a high fiber diet with liberal fluid intake. You may resume your   current medications today. Await biopsy results. Please call if any questions or concerns.   

## 2014-11-15 NOTE — Progress Notes (Signed)
Transferred to recovery room. A/O x3, pleased with MAC.  VSS.  Report to Annette, RN. 

## 2014-11-15 NOTE — Progress Notes (Signed)
No problems noted in the recovery room. maw 

## 2014-11-15 NOTE — Progress Notes (Signed)
Called to room to assist during endoscopic procedure.  Patient ID and intended procedure confirmed with present staff. Received instructions for my participation in the procedure from the performing physician.  

## 2014-11-15 NOTE — Op Note (Signed)
[place]  [address] [place], [id number]   COLONOSCOPY PROCEDURE REPORT  PATIENT: Susan, Smith  MR#: [id number] BIRTHDATE: 01-22-1971 , 59  yrs. old GENDER: female ENDOSCOPIST: Eustace Quail, MD REFERRED OH:[id number] Todd, MD PROCEDURE DATE:  11/15/2014 PROCEDURE:   Colonoscopy, diagnostic and Colonoscopy with snare polypectomy x 1 First Screening Colonoscopy - Avg.  risk and is 50 yrs.  old or older - No.  Prior Negative Screening - Now for repeat screening. N/A  History of Adenoma - Now for follow-up colonoscopy & has been > or = to 3 yrs.  N/A  Polyps removed today? Yes ASA CLASS:   Class I INDICATIONS:Evaluation of unexplained GI bleeding.   . Also, intermittent lower abdominal pain. Has not had either problem since the office visit August 2016 MEDICATIONS: Monitored anesthesia care and Propofol 300 mg IV  DESCRIPTION OF PROCEDURE:   After the risks benefits and alternatives of the procedure were thoroughly explained, informed consent was obtained.  The digital rectal exam revealed no abnormalities of the rectum.   The LB [id number]  endoscope was introduced through the anus and advanced to the cecum, which was identified by both the appendix and ileocecal valve. No adverse events experienced.   The quality of the prep was excellent. (Suprep was used)  The instrument was then slowly withdrawn as the colon was fully examined. Estimated blood loss is zero unless otherwise noted in this procedure report.     COLON FINDINGS: A sessile polyp measuring 5 mm in size was found in the rectum.  A polypectomy was performed with a cold snare.  The resection was complete, the polyp tissue was completely retrieved and sent to histology.   The examination was otherwise normal. Retroflexed views revealed internal hemorrhoids. The time to cecum = 2.4 Withdrawal time = 11.8   The scope was withdrawn and the procedure completed. COMPLICATIONS: There were  no immediate complications.  ENDOSCOPIC IMPRESSION: 1.   Sessile polyp was found in the rectum; polypectomy was performed with a cold snare 2.   The examination was otherwise normal  RECOMMENDATIONS: 1. Repeat colonoscopy in 5 years if polyp adenomatous; otherwise 10 years  eSigned:  Eustace Quail, MD 11/15/2014 4:03 PM   cc: The Patient and Dorena Cookey, MD

## 2014-11-16 ENCOUNTER — Telehealth: Payer: Self-pay | Admitting: Emergency Medicine

## 2014-11-16 NOTE — Telephone Encounter (Signed)
Left message, identifier present 

## 2014-11-21 ENCOUNTER — Encounter: Payer: Self-pay | Admitting: Internal Medicine

## 2014-12-07 ENCOUNTER — Other Ambulatory Visit (INDEPENDENT_AMBULATORY_CARE_PROVIDER_SITE_OTHER): Payer: BLUE CROSS/BLUE SHIELD

## 2014-12-07 DIAGNOSIS — Z0000 Encounter for general adult medical examination without abnormal findings: Secondary | ICD-10-CM

## 2014-12-07 LAB — HEPATIC FUNCTION PANEL
ALBUMIN: 4.1 g/dL (ref 3.5–5.2)
ALK PHOS: 49 U/L (ref 39–117)
ALT: 16 U/L (ref 0–35)
AST: 12 U/L (ref 0–37)
Bilirubin, Direct: 0.2 mg/dL (ref 0.0–0.3)
TOTAL PROTEIN: 6.5 g/dL (ref 6.0–8.3)
Total Bilirubin: 1.5 mg/dL — ABNORMAL HIGH (ref 0.2–1.2)

## 2014-12-07 LAB — POCT URINALYSIS DIPSTICK
BILIRUBIN UA: NEGATIVE
Blood, UA: NEGATIVE
GLUCOSE UA: NEGATIVE
KETONES UA: NEGATIVE
LEUKOCYTES UA: NEGATIVE
NITRITE UA: NEGATIVE
PH UA: 7
Protein, UA: NEGATIVE
Spec Grav, UA: 1.015
Urobilinogen, UA: 0.2

## 2014-12-07 LAB — BASIC METABOLIC PANEL
BUN: 12 mg/dL (ref 6–23)
CHLORIDE: 106 meq/L (ref 96–112)
CO2: 26 mEq/L (ref 19–32)
CREATININE: 0.74 mg/dL (ref 0.40–1.20)
Calcium: 9.1 mg/dL (ref 8.4–10.5)
GFR: 90.57 mL/min (ref >60.00)
Glucose, Bld: 88 mg/dL (ref 70–99)
Potassium: 4.2 mEq/L (ref 3.5–5.1)
Sodium: 139 mEq/L (ref 135–145)

## 2014-12-07 LAB — CBC WITH DIFFERENTIAL/PLATELET
BASOS PCT: 0.4 % (ref 0.0–3.0)
Basophils Absolute: 0 10*3/uL (ref 0.0–0.1)
EOS ABS: 0.1 10*3/uL (ref 0.0–0.7)
EOS PCT: 1 % (ref 0.0–5.0)
HEMATOCRIT: 40.2 % (ref 36.0–46.0)
HEMOGLOBIN: 14 g/dL (ref 12.0–15.0)
LYMPHS PCT: 24.6 % (ref 12.0–46.0)
Lymphs Abs: 2 10*3/uL (ref 0.7–4.0)
MCHC: 34.8 g/dL (ref 30.0–36.0)
MCV: 89.4 fl (ref 78.0–100.0)
MONOS PCT: 6.3 % (ref 3.0–12.0)
Monocytes Absolute: 0.5 10*3/uL (ref 0.1–1.0)
Neutro Abs: 5.4 10*3/uL (ref 1.4–7.7)
Neutrophils Relative %: 67.7 % (ref 43.0–77.0)
Platelets: 238 10*3/uL (ref 150.0–400.0)
RBC: 4.5 Mil/uL (ref 3.87–5.11)
RDW: 12.8 % (ref 11.5–15.5)
WBC: 8 10*3/uL (ref 4.0–10.5)

## 2014-12-07 LAB — LIPID PANEL
CHOLESTEROL: 184 mg/dL (ref 0–200)
HDL: 42.6 mg/dL (ref >39.00)
LDL Cholesterol: 116 mg/dL — ABNORMAL HIGH (ref 0–99)
NonHDL: 141.67
TRIGLYCERIDES: 126 mg/dL (ref 0.0–149.0)
Total CHOL/HDL Ratio: 4
VLDL: 25.2 mg/dL (ref 0.0–40.0)

## 2014-12-07 LAB — TSH: TSH: 2.18 u[IU]/mL (ref 0.35–4.50)

## 2014-12-12 ENCOUNTER — Ambulatory Visit (INDEPENDENT_AMBULATORY_CARE_PROVIDER_SITE_OTHER): Payer: BLUE CROSS/BLUE SHIELD | Admitting: Family Medicine

## 2014-12-12 ENCOUNTER — Encounter: Payer: Self-pay | Admitting: Family Medicine

## 2014-12-12 VITALS — BP 110/80 | Temp 98.6°F | Ht 64.0 in | Wt 198.0 lb

## 2014-12-12 DIAGNOSIS — Z0000 Encounter for general adult medical examination without abnormal findings: Secondary | ICD-10-CM | POA: Diagnosis not present

## 2014-12-12 DIAGNOSIS — Z98891 History of uterine scar from previous surgery: Secondary | ICD-10-CM

## 2014-12-12 DIAGNOSIS — E669 Obesity, unspecified: Secondary | ICD-10-CM

## 2014-12-12 NOTE — Patient Instructions (Signed)
Consider the weight watchers program........... I think it's the best and most reasonable weight loss program  Carbohydrate free diet...........Marland Kitchen fish check and [place] baked broiled grilled........ fresh fruits,,,,,,,,, vegetables,,,,,,,, water,,,,,,,,,,, walk 30 minutes daily

## 2014-12-12 NOTE — Progress Notes (Signed)
Pre visit review using our clinic review tool, if applicable. No additional management support is needed unless otherwise documented below in the visit note. 

## 2014-12-12 NOTE — Progress Notes (Signed)
   Subjective:    Patient ID: [name], female    DOB: Sep 07, 1970, 44 y.o.   MRN: [id number]  HPI [name] is a 44 year old married female nonsmoker G1 P1........ 38-year-old daughter........ who comes in today for general physical examination  She continues to struggle with her weight. She did get her weight down but now it's back up to 198 pounds. She 64 inches tall. Her BMI is 35.7  She gets routine eye care, dental care, BSE monthly, annual mammography, colonoscopy because of some rectal bleeding in September 2016. The bleeding turn out to be hemorrhoidal. They also found 1 polyp.  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 She would like to start a diet and exercise program  She is using nothing for birth control she would like to have a second child  Pelvic and Pap by GYN  Vaccinations history tetanus 2013 she declines a flu shot because she said it gave her the flu. After thorough explanation by myself and Apolonio Schneiders she continued to decline the flu shot   Review of Systems  Constitutional: Negative.   HENT: Negative.   Eyes: Negative.   Respiratory: Negative.   Cardiovascular: Negative.   Gastrointestinal: Negative.   Endocrine: Negative.   Genitourinary: Negative.   Musculoskeletal: Negative.   Skin: Negative.   Allergic/Immunologic: Negative.   Neurological: Negative.   Hematological: Negative.   Psychiatric/Behavioral: Negative.        Objective:   Physical Exam  Constitutional: She appears well-developed and well-nourished.  HENT:  Head: Normocephalic and atraumatic.  Right Ear: External ear normal.  Left Ear: External ear normal.  Nose: Nose normal.  Mouth/Throat: Oropharynx is clear and moist.  Eyes: EOM are normal. Pupils are equal, round, and reactive to light.  Neck: Normal range of motion. Neck supple. No JVD present. No tracheal deviation present. No thyromegaly present.  Cardiovascular: Normal rate, regular rhythm, normal heart  sounds and intact distal pulses.  Exam reveals no gallop and no friction rub.   No murmur heard. Pulmonary/Chest: Effort normal and breath sounds normal. No stridor. No respiratory distress. She has no wheezes. She has no rales. She exhibits no tenderness.  Abdominal: Soft. Bowel sounds are normal. She exhibits no distension and no mass. There is no tenderness. There is no rebound and no guarding.  Genitourinary:  Bilateral breast exam normal  Musculoskeletal: Normal range of motion.  Lymphadenopathy:    She has no cervical adenopathy.  Neurological: She is alert. She has normal reflexes. No cranial nerve deficit. She exhibits normal muscle tone. Coordination normal.  Skin: Skin is warm and dry. No rash noted. No erythema. No pallor.  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 Advised meticulous skin care  Psychiatric: She has a normal mood and affect. Her behavior is normal. Judgment and thought content normal.  Nursing note and vitals reviewed.         Assessment & Plan:  Healthy female  Obesity class II............ again discussed diet exercise and weight loss

## 2015-02-05 ENCOUNTER — Other Ambulatory Visit: Payer: Self-pay

## 2015-02-05 ENCOUNTER — Ambulatory Visit
Admission: RE | Admit: 2015-02-05 | Discharge: 2015-02-05 | Disposition: A | Discharge status: Home / Self Care | Payer: BLUE CROSS/BLUE SHIELD | Source: Ambulatory Visit

## 2015-02-05 DIAGNOSIS — Z1231 Encounter for screening mammogram for malignant neoplasm of breast: Secondary | ICD-10-CM

## 2018-09-15 NOTE — Nursing Note (Signed)
Adult Admission Assessment - Text       Perioperative Admission Assessment Entered On:  09/15/2018 11:39 EDT    Performed On:  09/15/2018 11:31 EDT by Kathleen LimeSpehr, RN, Beatris Siebecca               General   Call Start :   09/15/2018 16:00 EDT   Call Complete :   09/15/2018 16:22 EDT   Primary Care Physician/Specialists :   Kelle DartingL. ALLISON - PCP  DR Bethann GooM BAKER   Emergency Contact Name :   Malachi BondsERIK VAN GEER - HUSBAND   Emergency Contact Phone :   [phone]   Languages :   Lenox PondsEnglish   Information Given By :   Virgina OrganSelf       Spehr, RN, Lurena Joinerebecca - 09/15/2018 16:00 EDT     Height/Length Estimated :   160.6 cm(Converted to: 5 ft 3 in, 5.27 ft, 63.23 in)    BMI   Estimated :   95.8 kg(Converted to: 211 lb 3 oz, 211.203 lb)    Body Mass Index Estimated :   37.14 kg/m2   Spehr, RLurena Joiner, Rebecca - 09/15/2018 11:31 EDT   Allergies   (As Of: 09/24/2018 09:58:42 EDT)   Allergies (Active)   penicillin  Estimated Onset Date:   Unspecified ; Reactions:   HIVES ; Created By:   Isaac BlissSpehr, RN, Rebecca; Reaction Status:   Active ; Category:   Drug ; Substance:   penicillin ; Type:   Allergy ; Severity:   Moderate ; Updated By:   Kathleen LimeSpehr, RN, Lurena Joinerebecca; Reviewed Date:   09/24/2018 9:58 EDT        Medication History   Medication List   (As Of: 09/24/2018 09:58:42 EDT)   No Known Home Medications     Spehr, RN, Lurena JoinerRebecca - 09/15/2018 16:02:50      Prescription/Discharge Order    HYDROcodone-acetaminophen  :   HYDROcodone-acetaminophen ; Status:   Prescribed ; Ordered As Mnemonic:   Norco 325 mg-5 mg oral tablet ; Simple Display Line:   1 tabs, Oral, q6hr, PRN: moderate pain (4-7), 7 tabs, 0 Refill(s) ; Ordering Provider:   Theotis BurrowBAKER-MD,  MEGAN KEEFFE; Catalog Code:   HYDROcodone-acetaminophen ; Order Dt/Tm:   09/24/2018 08:53:47 EDT            Problem History   (As Of: 09/24/2018 09:58:42 EDT)   Problems(Active)    Breast mass, left (SNOMED CT  :[id number] )  Name of Problem:   Breast mass, left ; Recorder:   Spehr, RN, Lurena Joinerebecca; Confirmation:   Confirmed ; Classification:   Patient Stated ; Code:    [id number] ; Contributor System:   DietitianowerChart ; Last Updated:   09/15/2018 16:04 EDT ; Life Cycle Date:   09/15/2018 ; Life Cycle Status:   Active ; Vocabulary:   SNOMED CT        H/O migraine (SNOMED CT  :[id number] )  Name of Problem:   H/O migraine ; Recorder:   Spehr, RN, Lurena Joinerebecca; Confirmation:   Confirmed ; Classification:   Patient Stated ; Code:   [id number] ; Contributor System:   PowerChart ; Last Updated:   09/15/2018 16:09 EDT ; Life Cycle Date:   09/15/2018 ; Life Cycle Status:   Active ; Vocabulary:   SNOMED CT          Diagnoses(Active)    Breast mass, left  Date:   09/24/2018 ; Diagnosis Type:   Discharge ; Confirmation:   Confirmed ; Clinical Dx:   Breast mass, left ;  Classification:   Patient Stated ; Clinical Service:   Non-Specified ; Code:   ICD-10-CM ; Probability:   0 ; Diagnosis Code:   N63.20        Procedure History        -    Procedure History   (As Of: 09/24/2018 09:58:42 EDT)     Procedure Dt/Tm:   07/2010 ; Anesthesia Minutes:   0 ; Procedure Name:   D&C - Dilatation and curettage ; Procedure Minutes:   0 ; Last Reviewed Dt/Tm:   09/24/2018 09:58:26 EDT            Procedure Dt/Tm:   12/2011 ; Anesthesia Minutes:   0 ; Procedure Name:   C-section delivery ; Procedure Minutes:   0 ; Last Reviewed Dt/Tm:   09/24/2018 09:58:26 EDT            Procedure Dt/Tm:   02/2018 ; Anesthesia Minutes:   0 ; Procedure Name:   Colonoscopy ; Procedure Minutes:   0 ; Last Reviewed Dt/Tm:   09/24/2018 09:58:26 EDT            Anesthesia/Sedation   Anesthesia History :   Prior general anesthesia   SN - Malignant Hyperthermia :   Denies   Previous Problem with Anesthesia :   None   Moderate Sedation History :   Prior sedation for procedure   Previous Problem With Sedation :   None   Symptoms of Sleep Apnea :   No OSA Symptoms   Symptoms of Sleep Apnea Score :   0    Shortness of Breath Indicator :   No shortness of breath   Pregnancy Status :   Patient denies   Last Menstrual Period :   09/10/2018 EDT   Spehr, RN, Lurena Joinerebecca  - 09/15/2018 16:00 EDT   Bloodless Medicine   Is Blood Transfusion Acceptable to Patient :   Yes   Spehr, RN, Lurena Joinerebecca - 09/15/2018 16:00 EDT   ID Risk Screen Symptoms   Recent Travel History :   No recent travel   Close Contact with COVID-19 ID :   Preadmission testing patients only   Last 14 days COVID-19 ID :   No   TB Symptom Screen :   No symptoms   C. diff Symptom/History ID :   Neither of the above   Spehr, RN, Lurena JoinerRebecca - 09/15/2018 16:00 EDT   ID COVID-19 Screen   Fever OR Chills :   No   Headache :   No   New or Worsening Cough :   No   Fatigue :   No   Shortness of Breath ID :   No   Myalgia (Muscle Pain) :   No   Dyspnea :   No   Diarrhea :   No   Sore Throat :   No   Nausea :   No   Laryngitis :   No   Sudden Loss of Taste or Smell :   No   Spehr, RN, Lurena JoinerRebecca - 09/15/2018 16:00 EDT   Social History   Social History   (As Of: 09/24/2018 09:58:42 EDT)   Tobacco:        Tobacco use: Never (less than 100 in lifetime).   (Last Updated: 09/15/2018 16:13:43 EDT by Kathleen LimeSpehr, RN, Lurena Joinerebecca)          Alcohol:        Current, Beer, Wine, Liquor, 1-2 times per week   (Last Updated: 09/15/2018  16:14:05 EDT by Arville Care, RN, Wells Guiles)          Substance Use:        Opioid Naive, Denies   (Last Updated: 09/15/2018 16:14:24 EDT by Arville Care, RN, Wells Guiles)            Advance Directive   Advance Directive :   No   Spehr, RN, Wells Guiles - 09/15/2018 16:00 EDT   PAT Patient Instructions   Patient Arrival Time PAT :   09/24/2018 9:30 EDT   Medications in AM :   NONE   Medication Understanding :   Verbalizes understanding   NPO PAT :   NPO after midnight   Spehr, RN, Wells Guiles - 09/15/2018 16:00 EDT   PAT Instructions Grid   Make up Understanding :   Rosezena Sensor understanding   Jewelry Understanding :   Verbalizes understanding   Perfume Understanding :   Verbalizes understanding   Valuables Understanding :   Verbalizes understanding   Clothing Understanding :   Verbalizes understanding   Contact MD for Illness :   Emergency planning/management officer MD for  skin injury :   Verbalizes understanding   Spehr, RN, Wells Guiles - 09/15/2018 16:00 EDT   Service Line PAT :   Gen Surg   Laterality PAT :   Left   Prep PAT :   Hibiclens   Name of Contact PAT :   ERIK VAN GEER   Relationship of Contact PAT :   HUSBAND   Contact Number PAT :   [phone]   Transportation Instructions PAT :   Accompany to [facility] with 24 hours post-procedure   Additional Contacts PAT :   COVID 7/27 [id number]   Spehr, RN, Wells Guiles - 09/15/2018 16:00 EDT   Harm Screen   Injuries/Abuse/Neglect in Household :   Denies   Feels Unsafe at Home :   No   Last 3 mo, thoughts killing self/others :   Patient denies   Carmelia Bake - 09/24/2018 9:57 EDT

## 2018-09-22 LAB — COVID-19: SARS-CoV-2: NOT DETECTED

## 2018-09-24 LAB — PREGNANCY, URINE
KIT LOT: 705543
Pregnancy, Urine: NEGATIVE

## 2018-09-24 NOTE — Anesthesia Post-Procedure Evaluation (Signed)
Postanesthesia Evaluation        Patient:   Katelyn Schneider, Katelyn Schneider            MRN: [id number]            FIN: [id number]               Age:   48 years     Sex:  Female     DOB:  Oct 19, 1970   Associated Diagnoses:   None   Author:   Neomia Glass      Postoperative Information   Post Operative Info:          Post operative day: Post Anesthesia Care Unit.         Patient location: PACU.       Assessment   Postanesthesia assessment   Vitals: Vital Signs   0/02/7492 49:67 EDT Systolic Blood Pressure 591 mmHg    Diastolic Blood Pressure 73 mmHg    Temperature Axillary 36.4 degC    Heart Rate Monitored 61 bpm    Respiratory Rate 18 br/min    SpO2 99 %    SBP/DBP Cuff Details Right arm      , Measurements from flowsheet : Measurements   09/24/2018 10:07 EDT Body Mass Index est meas 36.72 kg/m2    Body Mass Index Measured 36.72 kg/m2   09/24/2018 10:06 EDT Height/Length Measured 160.6 cm    Weight Dosing 94.7 kg      .     Signature Line     Electronically Signed on 09/24/2018 12:43 PM EDT   ________________________________________________   Neomia Glass

## 2018-09-24 NOTE — Op Note (Signed)
Phase II Record - [facility]             Phase II Record - [facility] Summary                                                                  Primary Physician:        Theotis BurrowBAKER-MD,  MEGAN KEEFFE    Case Number:              [id number]    Finalized Date/Time:      09/24/18 13:42:54    Pt. Name:                 Katelyn Schneider, Katelyn Schneider    D.O.B./Sex:               01/31/1971    Female    Med Rec #:                [id number]    Physician:                Theotis BurrowBAKER-MD,  MEGAN KEEFFE    Financial #:              [id number]    Pt. Type:                 S    Room/Bed:                 /    Admit/Disch:              09/24/18 05:41:00 -    Institution:       [facility] Case Attendance - Phase II                                                                                           Entry 1                         Entry 2                                                                          Case Attendee             BAKER-MD,  MEGAN KEEFFE         Glasson, RN, Nelda Bucksanya M    Role Performed            Surgeon Primary                 Post Anesthesia Care  Nurse    Time In                                                   09/24/18 13:10:00    Time Out                                                  09/24/18 13:42:00    Last Modified By:         Simonne MartinetGlasson, RN, Alejandro Mullinganya M            Glasson, RN, Nelda Bucksanya M                              09/24/18 12:36:16               09/24/18 13:42:14      [facility] Case Attendance - Phase II Audit                                                            09/24/18 13:42:14         Owner: [id number]                              Modifier: [id number]                                                       <+> 2         Time In        <+> 2         Time Out        [facility] - Case Times - Phase II                                                                                              Entry 1  Phase II In               09/24/18 13:10:00               Phase II Out                    09/24/18 13:42:00    Phase II Discharge        09/24/18 13:42:00    Time     Last Modified By:         Simonne Martinet RN, Nelda Bucks                              09/24/18 13:42:24              Finalized By: Simonne Martinet RN, Nelda Bucks      Document Signatures                                                                             Signed By:           Aldean Jewett 09/24/18 13:42

## 2018-09-24 NOTE — Procedures (Signed)
Breast Procedure Progress Note        Patient:   ARLOA, PRAK            MRN: [id number]            FIN: [id number]               Age:   48 years     Sex:  Female     DOB:  1970-03-02   Associated Diagnoses:   None   Author:   Voncille Lo      Breast Procedure Progress Note    Physician: Autumn Patty  Imaging procedure and order verification safety checklist completed: Yes  Allergies documented and available in allergy section of the EMR.    Procedure performed: Wire loc  Site: inferior ribbon clip  Breast:  Left     Axilla:   [ ]  Cyst aspiration   [x ] Wire localization   [ ]  US biopsy    [ ]  Stereotactic biopsy   [ ]  MRI biopsy    Procedure and Findings:   [ ]  Mass   [ ]  Calcification   [ x] Clip   [ ]  Other    Puncture Site:   [ ]  Medial   [ ]  Lateral   [ ]  Superior   [ x] Inferior    Contrast:   [x ] None.     [ ]  Other    Medications:   Aris.Lanes ]   cc 1% Lidocaine    Specimens Removed: None unless noted,   [ ]  Core samples   [ ]  Cyst fluid   [ ]  Other    Estimated Blood Loss: None unless noted.    Postoperative Diagnosis:   [ ]  Cyst aspiration performed   [ ]  Stereotactic biopsy performed   [x ] Wire localization performed   [ ]  US biopsy performed   [ ]  MRI biopsy performed    Disposition:   [ ]  Home   [ x] Surgery department   [ ]  Nurse holding   [ ]  Other:    Comments: None unless noted.      Signature Line     Electronically Signed on 09/24/2018 12:55 PM EDT   ________________________________________________   Voncille Lo

## 2018-09-24 NOTE — Procedures (Signed)
Breast Procedure Progress Note        Patient:   Katelyn Schneider, Katelyn Schneider            MRN: [id number]            FIN: [id number]               Age:   48 years     Sex:  Female     DOB:  03-31-1970   Associated Diagnoses:   None   Author:   Voncille Lo      Breast Procedure Progress Note    Physician:Jaber Dunlow   Imaging procedure and order verification safety checklist completed: Yes  Allergies documented and available in allergy section of the EMR.    Procedure performed: Wire loc  Site: ribbon clip inferior left  Breast:  Left     Axilla:   [ ]  Cyst aspiration   [x ] Wire localization   [ ]  US biopsy    [ ]  Stereotactic biopsy   [ ]  MRI biopsy    Procedure and Findings:   [ ]  Mass   [ ]  Calcification   [x ] Clip   [ ]  Other    Puncture Site:   [ ]  Medial   [ ]  Lateral   [ ]  Superior   [ x] Inferior    Contrast:   [ x] None.     [ ]  Other    Medications:   [ 3]   cc 1% Lidocaine     Specimens Removed: None unless noted,   [ ]  Core samples   [ ]  Cyst fluid   [ ]  Other    Estimated Blood Loss: None unless noted.    Postoperative Diagnosis:   [ ]  Cyst aspiration performed   [ ]  Stereotactic biopsy performed   [x ] Wire localization performed   [ ]  US biopsy performed   [ ]  MRI biopsy performed    Disposition:   [ ]  Home   [x ] Surgery department   [ ]  Nurse holding   [ ]  Other:    Comments: None unless noted.      Signature Line     Electronically Signed on 09/24/2018 12:54 PM EDT   ________________________________________________   Voncille Lo

## 2018-09-24 NOTE — Procedures (Signed)
IntraOp Record - [facility]             IntraOp Record - [facility] Summary                                                                   Primary Physician:        Sandy Salaam KEEFFE    Case Number:              [id number]    Finalized Date/Time:      09/24/18 12:40:54    Pt. Name:                 Katelyn Schneider, Katelyn Schneider    D.O.B./Sex:               [birth year]    Female    Med Rec #:                [id number]    Physician:                Sandy Salaam KEEFFE    Financial #:              [id number]    Pt. Type:                 S    Room/Bed:                 /    Admit/Disch:              09/24/18 05:41:00 -    Institution:       [facility] - Case Times                                                                                                         Entry 1                                                                                                          Patient      In Room Time             09/24/18 12:02:00               Out Room Time                   09/24/18 12:37:00    Anesthesia     Procedure  Start Time               09/24/18 12:19:00               Stop Time                       09/24/18 12:37:00    Last Modified By:         Jana Hakim 09/24/18                              12:40:44      [facility] - Case Times Audit                                                                          09/24/18 12:40:44         Owner: [id number]                               Modifier: [id number]                                                        <+> 1         Start Time     09/24/18 12:40:34         Owner: [id number]                               Modifier: [id number]                                                        <+> 1         Out Room Time     09/24/18 12:37:18         Owner: [id number]                               Modifier: [id number]                                                        <+> 1         Stop Time        [facility] - Case Attendance  Entry 1                         Entry 2                         Entry 3                                          Case Attendee             HUNTLEY-CRNA,  Haskell Flirt,  MEGAN KEEFFE         Sorrentino, RN,                                                                                              Park Breed    Role Performed            CRNA                            Surgeon Primary                 Circulator    Time In                   09/24/18 12:02:00               09/24/18 12:02:00               09/24/18 12:02:00    Time Out                  09/24/18 12:37:00               09/24/18 12:37:00               09/24/18 12:37:00    Procedure                 Breast Biopsy and/or            Breast Biopsy and/or            Breast Biopsy and/or                              Lumpectomy(Left)                Lumpectomy(Left)                Lumpectomy(Left)    Last Modified By:         Marcha Solders RN,                 Sorrentino, RN,                 Sorrentino, RN,  Park Breed 09/24/18            Colletta Antrim L 09/24/18            Stephanie L 09/24/18                              12:40:45                        12:40:45                        12:40:45                                Entry 4                         Entry 5                         Entry 6                                          Case Attendee             Ginn, RN, Bruna Potter,  Deniece Ree, RN, Naomie Dean    Role Performed            Surgical Scrub Relief           Surgical Scrub                  Circulator Relief    Time In                   09/24/18 12:02:00               09/24/18 12:19:00               09/24/18 12:02:00    Time Out                  09/24/18 12:37:00               09/24/18 12:37:00               09/24/18 12:37:00    Procedure                 Breast Biopsy and/or            Breast Biopsy and/or            Breast Biopsy  and/or                              Lumpectomy(Left)                Lumpectomy(Left)                Lumpectomy(Left)    Last Modified By:         Marcha Solders, RN,  Sorrentino, RN,                 Sorrentino, RN,                              Park Breed 09/24/18            Park Breed 09/24/18            Stephanie L 09/24/18                              12:40:45                        12:40:45                        12:40:45      [facility] - Case Attendance Audit                                                                     09/24/18 12:40:45         Owner: [id number]                               Modifier: [id number]                                                            1     <+> Time Out            1     <*> Procedure                              Breast Biopsy and/or Lumpectomy(Left)            2     <+> Time Out            2     <*> Procedure                              Breast Biopsy and/or Lumpectomy(Left)            3     <+> Time Out            3     <*> Procedure                              Breast Biopsy and/or Lumpectomy(Left)            4     <+> Time Out            4     <*> Procedure  Breast Biopsy and/or Lumpectomy(Left)            5     <+> Time Out            5     <*> Procedure                              Breast Biopsy and/or Lumpectomy(Left)            6     <+> Time In            6     <+> Time Out            6     <*> Procedure                              Breast Biopsy and/or Lumpectomy(Left)     09/24/18 12:22:43         Owner: [id number]                               Modifier: [id number]                                                            1     <*> Procedure                              Breast Biopsy and/or Lumpectomy(Left)            2     <*> Procedure                              Breast Biopsy and/or Lumpectomy(Left)            3     <+> Time In            3     <*> Procedure                              Breast Biopsy and/or Lumpectomy(Left)             4     <+> Time In            4     <*> Procedure                              Breast Biopsy and/or Lumpectomy(Left)            5     <*> Procedure                              Breast Biopsy and/or Lumpectomy(Left)        <+> 6         Case Attendee        <+> 6         Role Performed        <+> 6  Procedure     09/24/18 12:19:57         Owner: [id number]                               Modifier: [id number]                                                            1     <+> Time In            1     <*> Procedure                              Breast Biopsy and/or Lumpectomy(Left)            2     <+> Time In            2     <*> Procedure                              Breast Biopsy and/or Lumpectomy(Left)        <+> 3         Case Attendee        <+> 3         Role Performed        <+> 3         Procedure        <+> 4         Case Attendee        <+> 4         Role Performed        <+> 4         Procedure        <+> 5         Case Attendee        <+> 5         Role Performed        <+> 5         Time In        <+> 5         Procedure        [facility] - Skin Assessment                                                                          Pre-Care Text:            A.240 Assesses baseline skin condition  Im.120 Implements protective measures to prevent skin or tissue injury           due to mechanical sources   Im.280.1 Implements progective measures to prevent skin or tissue injury due to           thermal sources  Im.360 Monitors for signs and symptons of infection  Entry 1                                                                                                          Skin Integrity            Intact    Last Modified By:         Marcha Solders, RN,                              Park Breed 09/24/18                              12:24:32    Post-Care Text:            E.10 Evaluates for signs and symptoms of physical injury to skin and tissue  E.270 Evaluate tissue perfusion           O.60  Patient is free from signs and symptoms of injury caused by extraneous objects    O.210 Patinet's tissue           perfusion is consistent with or improved from baseline levels      [facility] - Patient Positioning                                                                      Pre-Care Text:            A.240 Assesses baseline skin condition  A.280 Identifies baseline musculoskeletal status  A.280.1 Identifies           physical alterations that require additional precautions for procedure-specific positioning  A.510.8 Maintains           patient's dignity and privacy  Im.120 Implements protective measures to prevent skin/tissue injury due to           mechanical sources  Im.40 Positions the patient  Im.80 Applies safety devices                              Entry 1                                                                                                          Procedure  Breast Biopsy and/or            Body Position                   Supine                              Lumpectomy(Left)    Left Arm Position         Extended on Padded Arm          Right Arm Position              Extended on Padded Arm                              Board w/Security Strap                                          Board w/Security Strap    Left Leg Position         Extended Security               Right Leg Position              Extended Security                              Strap, Pillow Under Knee                                        Strap, Pillow Under                                                                                              Lower Leg    Feet Uncrossed            Yes                             Pressure Points                 Yes                                                              Checked     Positioning Device        Foam Padding, Pillow,           Positioned By                   [employer], RN,  Heel pad                                                         Stephanie L, BAKER-MD,                                                                                              MEGAN KEEFFE,                                                                                              HUNTLEY-CRNA,  KACY R    Outcome Met (O.80)        Yes    Last Modified By:         Marcha Solders, RN,                              Park Breed 09/24/18                              12:24:06    Post-Care Text:            E.10 Evaluates for signs and symptoms of physical injury to skin and tissue  E.290 Evaluates musculoskeletal           status  O.80 Patient is free from signs and symptoms of injury related to positioning  O.120 the patient is           free from signs and symptoms of injury related to transfer/transport   O.250 Patient's musculoskeletal status           is maintained at or improved from baseline levels      [facility] - Skin Prep                                                                                Pre-Care Text:            A.30 Verifies allergies  A.20 Verifies procedure, surgical site, and laterality  A.510.8 Maintains paritnet's           dignity and privacy  Im.270 Performs Skin Preparation  Im.270.1 Implements protective measures to prevent skin  and tissue injury due to chemical sources   A.300.1 Protects from cross-contamination                              Entry 1                                                                                                          Hair Removal     Skin Prep      Prep Agents (Im.270)     Chlorhexidine Gluconate         Prep Area (Im.270)              Chest and Trunk                              2% w/Alcohol     Prep Area Details        Left                            Prep By                         Marcha Solders RN,                                                                                              Stephanie L    Outcome Met (O.100)       Yes    Last Modified ByMarcha Solders, RN,                               Park Breed 09/24/18                              12:24:47    Post-Care Text:            E.10 Evaluates for signs and symptoms of physical injury to skin and tissue  O.100 Patient is free from signs           and symptoms of chemical injury   O.740 The patient's right to privacy is maintained      [facility] - Counts Initial and Final  Pre-Care Text:            A.38 Verifies operative procedure, sugical site, and laterality  A.20.2 Assesses the risk for unintended           retained foreign body  Im.20 Performs required counts                              Entry 1                                                                                                          Initial Counts      Initial Counts           Ginn, RN, Lauren,               Items included in               Sponges, Sharps     Performed By             Lynnell Jude, RN, Naomie Dean         the Initial Count     Final Counts      Final Counts             Sorrentino, RN,                 Final Count Status              Correct     Performed By             Corrinne Eagle L     Items Included in        Sponges, Sharps     Final Count     Outcome Met (O.20)        Yes    Last Modified By:         Marcha Solders RN,                              Park Breed 09/24/18                              12:32:28    Post-Care Text:            E.50 Evaluates results of the surgical count  O.20 Patient is free from unintended retained foreign objects      [facility] - Counts Initial and Final Audit  09/24/18 12:32:28         Owner: [id number]                               Modifier: [id number]                                                        <+> 1         Final Counts Performed By        <+> 1         Outcome Met (O.20)        [facility] - Counts Additional                                                                        Pre-Care Text:             A.20 Verifies operative procedure, sugical site, and laterality  A.20.2 Assesses the risk for unintended           retained foreign body  Im.20 Performs required counts                              Entry 1                                                                                                          Additional Count          Closing Count                   Additional Count                Sorrentino, RN,    Type                                                      Participants                    Francis Dowse,  Jerri L    Count Status              Correct                         Items Counted                   Sponges, Sharps    Outcome Met (O.20)        Yes    Last Modified By:         Marcha Solders RN,                              Park Breed 09/24/18                              12:32:20    Post-Care Text:            E.50 Evaluates results of the surgical count  O.20 Patient is free from unintended retained foreign objects      [facility] - Counts Additional Audit                                                                   09/24/18 12:32:20         Owner: [id number]                               Modifier: [id number]                                                        <+> 1         Additional Count Participants        <+> 1         Outcome Met (O.20)        [facility] - General Case Data                                                                        Pre-Care Text:            A.350.1 Classifies surgical wound                              Entry 1  Case Information      ASA Class                2                               Case Level                      Level 2     OR                       MP 01                           Specialty                       General (SN)     Wound Class               1-Clean    Preop Diagnosis           N63..20                         Postop Diagnosis                N63..20    Last Modified By:         Marcha Solders RN,                              Park Breed 09/24/18                              12:20:19    Post-Care Text:            O.760 Patient receives consistent and comparable care regardless of the setting      [facility] - General Case Data Audit                                                                   09/24/18 12:20:19         Owner: [id number]                               Modifier: [id number]                                                        <+> 1         ASA Class        [facility] - Fire Risk Assessment  Entry 1                                                                                                          Fire Risk                 Alcohol Based Prep              Fire Risk Score                 3    Assessment: If            Solution, Surgical Site    checked, checkmark        Above Xiphoid, Ignition    = 1 point                 Source In Use    Last Modified By:         Marcha Solders, RN,                              Park Breed 09/24/18                              12:23:30      [facility] - Time Out                                                                                 Pre-Care Text:            A.10 Confirms patient identity  A.20 Verifies operative procedure, surgical site, and laterality  A.20.1           Verifies consent for planned procedure  A.30 Verifies allergies                              Entry 1                                                                                                          Procedure                 Breast Biopsy and/or  Is everyone ready               Yes                              Lumpectomy(Left)                to perform time out     Have all members of       Yes                             Patient name and                 Yes    the surgical team                                         DOB confirmed     been introduced     Allergies discussed       Yes                             Surgical procedure              Yes                                                              to be performed                                                               confirmed and                                                               verified by                                                               completed surgical                                                               consent     Correct surgical          Yes  Correct laterality              Yes    site marked and                                           confirmed     initials are     visible through     prepped and draped     field (or     alternative ID band     used)     Correct patient           Yes                             Surgeon shares                  Yes    position confirmed                                        operative plan,                                                               possible                                                               difficulties,                                                               expected duration,                                                               anticipated blood                                                               loss and reviews                                                               all  critical/specific                                                               concerns     Required blood            Yes                             Essential imaging               Yes    products, implants,                                       available and fetal     devices and/or                                            heartones confirmed     special equipment                                          (if applicable)     available and     sterility confirmed     VTE prophylaxis           Yes                             Antibiotics ordered             Yes    addressed                                                 and administered     Anesthesia shares         Yes                             Fire risk                       Yes    anesthetic plan and                                       assessment scored     reviews patient                                           and plan discussed     specific concerns     Appropriate drying        Yes  Surgeon states:                 Yes    time for prep                                             Does anyone have     observed before                                           any concerns? If     draping                                                   you see, suspect,                                                               or feel that                                                               patient care is                                                               being compromised,                                                               speak up for                                                               patient safety     Time Out Complete         09/24/18 12:18:00    Last Modified By:         Marcha Solders RN,                              Park Breed 09/24/18                              12:24:28  Post-Care Text:            E.30 Evaluates verification process for correct patient, site, side, and level surgery      [facility] - Debrief                                                                                  Pre-Care Text:            Im.330 Manages specimen handling and disposition                              Entry 1                                                                                                          Procedure                 Breast Biopsy and/or            Final counts                     Yes                              Lumpectomy(Left)                correct and                                                               verbally verified                                                               with                                                               surgeon/licensed  independent                                                               practitioner (if                                                               applicable)     Actual procedure          Yes                             Postop diagnosis                Yes    performed confirmed                                       confirmed     Wound                     Yes                             Confirm specimens               Yes    classification                                            and specimens     confirmed                                                 labeled                                                               appropriately (if                                                               applicable)     Foley catheter            Yes                             Patient recovery                Yes  removed (if                                               plan confirmed     applicable)     Debrief Complete          09/24/18 12:37:00    Last Modified By:         Marcha Solders RN,                              Park Breed 09/24/18                              12:32:05    Post-Care Text:            E.800 Ensures continuity of care  E.50 Evaluates results of the surgical count  O.30 Patient's procedure is           performed on the correct site, side, and level  O.50 patient's current status is communicated throughout the           continuum of care  O.40 Patient's specimen(s) is managed in the appropriate manner      [facility] - Cautery                                                                                  Pre-Care Text:            A.240  Assesses baseline skin condition  A280.1 Identifies baseline musculoskeletal status  Im.50 Implements           protective measures to prevent injury due to electrical sources   Im.60 Uses supplies and equipment within safe           parameters  Im.80 Applies safety devices                              Entry 1                                                                                                          ESU Type                  GENERATOR                       Identification                  [id number]  COVIDIEN/[facility]              Number     Coag Setting (watts)      35                              Cut Setting (watts)             35    Grounding Pad             Yes                             Grounding Pad Site              Thigh, right    Needed?     Grounding [employer], Therapist, sports,                 ESU Comment                     [id number]    Applied By                Park Breed    Outcome Met (O.10)        Yes    Last Modified By:         Marcha Solders, RN,                              Park Breed 09/24/18                              12:22:19    Post-Care Text:            E.10 Evaluates for signs and symptoms of physical injury to skin and tissue  O.10 Patient is free from signs           and symptoms of injury related to thermal sources   O.70 Patient is free from signs and symptoms of electrical           injury      [facility] - Patient Care Devices                                                                     Pre-Care Text:            A.200 Assesses risk for normothermia regulation  A.40 Verifies presence of prosthetics or corrective devices           Im.280 Implements thermoregulation measures  Im.60 Uses supplies and equipment within safe parameters                              Entry 1  Equipment Type            MACHINE SEQUENTIAL              SCD Sleeve  Site                 Legs Bilateral                              COMPRESSION    Equipment/Tag Number      [id number]                       Initiated Pre                   Yes                                                              Induction     Last Modified By:         Marcha Solders RN,                              Park Breed 09/24/18                              12:23:54    Post-Care Text:            E.10 Evaluates signs and symptoms of physical injury to skin and tissue  O.60 Patient is free from signs and           symptoms of injury caused by extraneous objects      MPOR - Medications                                                                              Pre-Care Text:            A.10 Confirms patient identity  A.30 Verifies allergies  Im.220 Administers prescribed medications  Im.220.2           Administers prescribed antibiotic therapy as ordered                              Entry 1                                                                                                          Medication  BUPIVACAINE HCL 0.25%           Route of Admin                  Local Injection                              MPF INJECTION 0.25% 30ML    Site                      Breast                          Site Detail                     Left    Administered By           Sandy Salaam KEEFFE         Outcome Met (O.130)             Yes    Last Modified By:         Marcha Solders, RN,                              Park Breed 09/24/18                              12:23:45    Post-Care Text:            E.20 Evaluates response to medications  O.130 Patient receives appropriately administerd medication(s)      MPOR - Specimens                                                                                                          Entry 1                                                                                                          Description               A. LEFT BREAST WIRE LOC         Specimen Type                    Routine                              SINGLE SUPERIOR DOUBLE  DEEP    Date/Time in              09/24/18 12:29:00    Formalin (for     breast tissue only)     Last Modified By:         Jana Hakim 09/24/18                              12:31:45      MPOR - Dressing/Packing                                                                         Pre-Care Text:            A.350 Assesses susceptibility for infection  Im.250 Administers care to invasive devices  Im.290 Administer           care to wound sites   Im.300 Implements aseptic technique                              Entry 1                                                                                                          Site                      Breast                          Site Details                    Left    Dressing Item     Details      Dressing Item            Liquid Bandage     (Im.290)     Last Modified By:         Marcha Solders RN,                              Park Breed 09/24/18                              12:23:24    Post-Care Text:            E.320 Evaluate factors associted with increased risk for postoperative infection at the completion of the  procedure  O.200 Patient's wound perfusion is consistent with or improved from baseline levels   O.Patient is           free from signs and symptoms of infection      MPOR - Procedures                                                                               Pre-Care Text:            A.20 Verifies operative procedure, surgical site, and laterality  Im.150 Develops individualized plan of care                              Entry 1                                                                                                          Procedure     Description      Procedure                Breast Biopsy and/or            Modifiers                       Left                              Lumpectomy      Surgical Procedure       LEFT BREAST WIRE GUIDED     Text                     LUMPECTOMY    Primary Procedure         Yes                             Primary Surgeon                 Sandy Salaam [facility]    Start                     09/24/18 12:19:00               Stop                            09/24/18 12:37:00    Anesthesia Type           General                         Surgical Service  General (SN)    Wound Class               1-Clean    Last Modified By:         Jana Hakim 09/24/18                              12:40:46    Post-Care Text:            O.730 The patinet's care is consistent with the individualized perioperative plan of care      MPOR - Procedures Audit                                                                          09/24/18 12:40:46         Owner: [id number]                               Modifier: [id number]                                                        <+> 1         Start     09/24/18 12:37:21         Owner: [id number]                               Modifier: [id number]                                                        <+> 1         Stop        MPOR - Transfer                                                                                                           Entry 1  Transferred By            Marcha Solders, RN,                 Via                             Libby Maw,                              HUNTLEY-CRNA,  [facility] R    Post-op Destination       PACU    Skin Assessment      Condition                Intact    Last Modified By:         Jana Hakim 09/24/18                              12:24:54      Case Comments                                                                                         <None>              Finalized By:  Jana Hakim      Document Signatures                                                                             Signed By:           Marcha Solders RN, Park Breed 09/24/18 12:40

## 2018-09-24 NOTE — Case Communication (Signed)
Discharge Follow-Up Form - Text       Discharge Follow-Up Entered On:  10/07/2018 9:52 EDT    Performed On:  10/07/2018 9:52 EDT by Graciella Freer, RN, KELLY E               Clinical   Provider Follow-Up Post Discharge RTF :   Follow-Up Appointments    With:  [facility]  Address:  business (1), [address], [place], [place], [address];[phone] Business (1)    With:  LEE ALLISON-MD  Address:  business (1), [address], [place], [place], [address];[phone] Business (1)       REDICK, RN, Tresa Endo E - 10/07/2018 9:52 EDT   Surgery Evaluation   CM Surg DC Instr Clr :   Yes   CM Surg Pain Controlled :   Yes   CM Surg Nausea/Vomiting :   Yes   CM Surg FU Appt :   Yes   CM Surg Staff Recognize :   No   REDICK, RN, KELLY E - 10/07/2018 9:52 EDT   Status   Case Status :   Completed   REDICK, RN, KELLY E - 10/07/2018 9:52 EDT

## 2018-09-24 NOTE — Case Communication (Signed)
Discharge Follow-Up Form - Text       Discharge Follow-Up Entered On:  10/07/2018 9:51 EDT    Performed On:  10/07/2018 9:51 EDT by Graciella Freer, RN, KELLY E               Clinical   Provider Follow-Up Post Discharge RTF :   Follow-Up Appointments    With:  [facility]  Address:  business (1), [address], [place], [place], [address];[phone] Business (1)    With:  LEE ALLISON-MD  Address:  business (1), [address];[place], [address];[phone] Business (1)       REDICK, RN, Claria Dice - 10/07/2018 9:51 EDT   Status   Case Status :   Incomplete   Phone Call History Post DC (Readmit) :   First call   REDICK, RN, KELLY E - 10/07/2018 9:51 EDT

## 2018-09-24 NOTE — Op Note (Signed)
Phase I Record - MPOR             Phase I Record - MPOR Summary                                                                   Primary Physician:        Theotis BurrowBAKER-MD,  MEGAN KEEFFE    Case Number:              [id number]    Finalized Date/Time:      09/24/18 13:31:26    Pt. Name:                 Katelyn Schneider, [name]    D.O.B./Sex:               1970-10-01    Female    Med Rec #:                [id number]    Physician:                Theotis BurrowBAKER-MD,  MEGAN KEEFFE    Financial #:              [id number]    Pt. Type:                 S    Room/Bed:                 /    Admit/Disch:              09/24/18 05:41:00 -    Institution:       MPOR Case Attendance - Phase I                                                                                            Entry 1                                                                                                          Case Attendee             Glasson, RN, Nelda Bucksanya M            Role Performed                  Post Anesthesia Care  Nurse    Time In                   09/24/18 12:39:00               Time Out                        09/24/18 13:09:00    Last Modified By:         Venita Sheffield                              09/24/18 13:28:05      MPOR Case Attendance - Phase I Audit                                                             09/24/18 13:28:05         Owner: [id number]                              Modifier: [id number]                                                       <+> 1         Time Out     09/24/18 12:46:46         Owner: [id number]                              Modifier: [id number]                                                       <+> 1         Time In        MPOR - Case Times - Phase I                                                                                               Entry 1  Phase I In                09/24/18 12:39:00               Phase I Out                     09/24/18 13:09:00    Phase I Discharge         09/24/18 13:09:00    Time     Last Modified By:         Aldean JewettGlasson, RN, Tanya M                              09/24/18 13:28:17      MPOR - Case Times - Phase I Audit                                                                09/24/18 13:28:17         Owner: [id number]                              Modifier: [id number]                                                       <+> 1         Phase I Out        <+> 1         Phase I Discharge Time                Finalized By: Aldean JewettGlasson, RN, Tanya M      Document Signatures                                                                             Signed By:           Simonne MartinetGlasson, RN, Nelda Bucksanya M 09/24/18 13:31

## 2018-09-24 NOTE — Anesthesia Pre-Procedure Evaluation (Signed)
Preanesthesia Evaluation        Patient:   Katelyn Schneider, Katelyn Schneider            MRN: [id number]            FIN: [id number]               Age:   48 years     Sex:  Female     DOB:  [birth year]   Associated Diagnoses:   None   Author:   Neomia Glass      Preoperative Information   Procedure/ Case: breast bx   NPO:  NPO greater than 8 hours.    Anesthesia history     Patient's history: negative.     Family's history: negative.        Health Status   Allergies:    Allergic Reactions (Selected)  Moderate  Penicillin- Hives.   Current medications:    Home Medications (1) Active  Norco 325 mg-5 mg oral tablet 1 tabs, PRN, Oral, q6hr  ,    No qualifying data available     Problem list:    Active Problems (2)  Breast mass, left   H/O migraine   ,    Problems   (Active Problems Only)    Breast lump   (SNOMED CT: 694854627, Onset: --)  H/O: migraine   (SNOMED CT: 035009381, Onset: --)        Histories   Past Medical History:    No active or resolved past medical history items have been selected or recorded.   Procedure history:    Colonoscopy ([id number]) in the month of 02/2018 at 40 Years.  C-section delivery [phone] CCE38) in the month of 12/2011 at 72 Years.  D&C - Dilatation and curettage (6144315400) in the month of 07/2010 at 39 Years.   Social History        Social & Psychosocial Habits    Alcohol  09/15/2018  Use: Current    Type: Beer, Liquor, Wine    Frequency: 1-2 times per week    Substance Use  09/15/2018  Opioid Assessment Opioid Naive    Use: Denies    Tobacco  09/15/2018  Use: Never (less than 100 in l  .        Physical Examination      No qualifying data available      Review / Management   Results review:     No qualifying data available.       Assessment and Plan   American Society of Anesthesiologists#(ASA) physical status classification:  Class II.    Anesthetic Preoperative Plan     Anesthetic technique: General anesthesia.     Maintenance airway: Laryngeal mask airway.     Opioid  Assessment: Opioid Na????ve.     Risks discussed: nausea, vomiting.     Signature Line     Electronically Signed on 09/24/2018 09:37 AM EDT   ________________________________________________   Neomia Glass

## 2018-09-24 NOTE — Case Communication (Signed)
Discharge Follow-Up Form - Text       Discharge Follow-Up Entered On:  10/02/2018 14:11 EDT    Performed On:  10/02/2018 14:11 EDT by Simonne Martinet, RN, Nelda Bucks               Clinical   Provider Follow-Up Post Discharge RTF :   Follow-Up Appointments    With:  [facility]  Address:  business (1), [address], [place], [place], [address];[phone] Business (1)    With:  LEE ALLISON-MD  Address:  business (1), [address];[place], [address];[phone] Business (1)       Glasson, Radio broadcast assistant - 10/02/2018 14:11 EDT   Status   Case Status :   Incomplete   Phone Call History Post DC (Readmit) :   First call   Glasson, RN, Nelda Bucks - 10/02/2018 14:11 EDT

## 2018-09-24 NOTE — Assessment & Plan Note (Signed)
PreOp Record - MPOR             PreOp Record - MPOR Summary                                                                     Primary Physician:        Theotis BurrowBAKER-MD,  MEGAN KEEFFE    Case Number:              [id number]    Finalized Date/Time:      09/24/18 10:06:10    Pt. Name:                 Katelyn Schneider, [name]    D.O.B./Sex:               [birth year]    Female    Med Rec #:                [id number]    Physician:                Theotis BurrowBAKER-MD,  MEGAN KEEFFE    Financial #:              [id number] [phone]    Pt. Type:                 S    Room/Bed:                 /    Admit/Disch:              09/24/18 05:41:00 -    Institution:       MPOR Case Attendance - Preop                                                                                              Entry 1                         Entry 2                                                                          Case Attendee             Grier MittsBAKER-MD,  MEGAN Adrian BlackwaterKEEFFE         Frank, RN, Darron DoomKerry O    Role Performed            Surgeon Primary                 Preoperative Nurse    Time In     Time Out     Last Modified  ByPilar Plate, RN, Peggye Pitt, RN, Hosie Poisson                              09/24/18 09:57:05               09/24/18 09:57:05      MPOR - Case Times - PreOp                                                                                                 Entry 1                                                                                                          Patient In Room Time      09/24/18 09:57:00               Nurse In Time                   09/24/18 09:57:00    Nurse Out Time            09/24/18 10:13:00               Patient Ready for               09/24/18 10:13:00                                                              Surgery/Procedure     Last Modified By:         Pilar Plate RN, Hosie Poisson                              09/24/18 10:06:00      MPOR - Case Times - PreOp Audit                                                                   09/24/18 10:06:00         Owner: Selinda Flavin  Modifier: Neita [email or web address]                                                        <+> 1         Patient Ready for Surgery/Procedure        <+> 1         Nurse Out Time                Finalized By: Homero FellersFrank, RN, Darron DoomKerry O      Document Signatures                                                                             Signed By:           Homero FellersFrank, RN, Darron DoomKerry O 09/24/18 10:06

## 2023-02-11 ENCOUNTER — Ambulatory Visit
Admit: 2023-02-11 | Discharge: 2023-02-11 | Payer: PRIVATE HEALTH INSURANCE | Attending: Foot Surgery | Admitting: Foot Surgery | Primary: Family Medicine

## 2023-02-11 DIAGNOSIS — M79672 Pain in left foot: Principal | ICD-10-CM

## 2023-02-11 MED ORDER — DICLOFENAC SODIUM 75 MG PO TBEC
75 MG | ORAL_TABLET | Freq: Two times a day (BID) | ORAL | 1 refills | Status: DC
Start: 2023-02-11 — End: 2023-03-25

## 2023-02-11 NOTE — Progress Notes (Signed)
 HPI:  Patient presents to the office today with complaint of pain in the bottom of the left heel.  Relates the pain has been present for a few months.  Relates no injury to the area.  States the pain is increased with for steps in the morning or after any

## 2023-03-11 ENCOUNTER — Encounter: Payer: PRIVATE HEALTH INSURANCE | Attending: Foot Surgery | Primary: Family Medicine

## 2023-03-18 ENCOUNTER — Encounter: Payer: PRIVATE HEALTH INSURANCE | Attending: Foot Surgery | Primary: Family Medicine

## 2023-03-25 ENCOUNTER — Ambulatory Visit
Admit: 2023-03-25 | Discharge: 2023-03-25 | Payer: PRIVATE HEALTH INSURANCE | Attending: Foot Surgery | Primary: Family Medicine

## 2023-03-25 DIAGNOSIS — M722 Plantar fascial fibromatosis: Secondary | ICD-10-CM

## 2023-03-25 NOTE — Progress Notes (Signed)
HPI:  Seen today follow up for pain in the left     heel.  States that they have improved   0%.   Have not been stretching, not been wearing supportive shoe gear but states that she did not take the oral medication due to potential side effects    PHYSICAL EXAM:  Musculoskeletal: On passive dorsiflexion, plantarflexion, inversion, eversion muscle strength is 5/5. There is no evidence of muscle atrophy noted bilaterally. There is no evidence of fibroma or gapping within the plantar fascia.  Patient is tender with palpation along the plantar fascia left arch  On direct palpation of the medial tubercle of the left calcaneus there is discomfort expressed by the patient. On lateral compression of the calcaneal body there is no discomfort expressed by the patient.      Neurologic When percussing along the course of the tarsal tunnel there is no evidence of pins-and-needles no evidence of Tinel's sign.      Vascular: dorsalis pedis pulse is palpable bilaterally with the capillary fill time of less than three seconds in all digits bilaterally. Skin temperature is warm .   Orthopedic    Dermatologic: Skin is soft and supple with good skin turgor. No evidence of any open lesions, ulcerations or maceration is noted.     Cognitive: Patient is A&Ox3.     ASSESSMENT:  1. Plantar fasciitis of left foot       PLAN  I discussed with the patient once again about the condition of plantar fasciitis.   Continue doing some stretching exercises every morning and ice in the evening  Discuss shoe modifications with patient and  consider using an arch support    Patient has a night splint at home and I have encouraged for her to use the night splint.  She is also dispensed a gel arch support to place on the left foot.    I did discuss with the patient about the benefit of physical therapy and they are sent for some physical therapy.    I encouraged her to take some over-the-counter Aleve or ibuprofen as needed.    Have patient reappoint back  in 3 to 4 weeks    Electronically signed by Enis Slipper, DPM on 03/25/2023 at 9:39 AM

## 2023-04-22 ENCOUNTER — Encounter: Attending: Foot Surgery | Primary: Family Medicine
